# Patient Record
Sex: Female | Born: 1950 | Race: White | Hispanic: No | Marital: Married | Smoking: Never smoker
Health system: Southern US, Community
[De-identification: ages and names within clinical notes are randomized; demographics above are authoritative.]

## PROBLEM LIST (undated history)

## (undated) DIAGNOSIS — E785 Hyperlipidemia, unspecified: Secondary | ICD-10-CM

## (undated) DIAGNOSIS — L570 Actinic keratosis: Secondary | ICD-10-CM

## (undated) DIAGNOSIS — E039 Hypothyroidism, unspecified: Secondary | ICD-10-CM

## (undated) HISTORY — PX: CARPAL TUNNEL RELEASE: SHX101

## (undated) HISTORY — DX: Actinic keratosis: L57.0

---

## 2015-08-02 ENCOUNTER — Ambulatory Visit: Payer: BLUE CROSS/BLUE SHIELD

## 2015-08-02 ENCOUNTER — Ambulatory Visit
Admission: EM | Admit: 2015-08-02 | Discharge: 2015-08-02 | Disposition: A | Discharge status: Home / Self Care | Payer: BLUE CROSS/BLUE SHIELD | Attending: Internal Medicine | Admitting: Internal Medicine

## 2015-08-02 DIAGNOSIS — W1809XA Striking against other object with subsequent fall, initial encounter: Secondary | ICD-10-CM | POA: Diagnosis not present

## 2015-08-02 DIAGNOSIS — S0990XA Unspecified injury of head, initial encounter: Secondary | ICD-10-CM | POA: Diagnosis not present

## 2015-08-02 HISTORY — DX: Hypothyroidism, unspecified: E03.9

## 2015-08-02 HISTORY — DX: Hyperlipidemia, unspecified: E78.5

## 2015-08-02 NOTE — Discharge Instructions (Signed)
Call PCP  Office on Tuesday for 1 week follow up exam Report to [facility] or ER with any change in condition as noted below or as discussed  Concussion A concussion, or closed-head injury, is a brain injury caused by a direct blow to the head or by a quick and sudden movement (jolt) of the head or neck. Concussions are usually not life-threatening. Even so, the effects of a concussion can be serious. If you have had a concussion before, you are more likely to experience concussion-like symptoms after a direct blow to the head.  CAUSES  Direct blow to the head, such as from running into another player during a soccer game, being hit in a fight, or hitting your head on a hard surface.  A jolt of the head or neck that causes the brain to move back and forth inside the skull, such as in a car crash. SIGNS AND SYMPTOMS The signs of a concussion can be hard to notice. Early on, they may be missed by you, family members, and health care providers. You may look fine but act or feel differently. Symptoms are usually temporary, but they may last for days, weeks, or even longer. Some symptoms may appear right away while others may not show up for hours or days. Every head injury is different. Symptoms include:  Mild to moderate headaches that will not go away.  A feeling of pressure inside your head.  Having more trouble than usual:  Learning or remembering things you have heard.  Answering questions.  Paying attention or concentrating.  Organizing daily tasks.  Making decisions and solving problems.  Slowness in thinking, acting or reacting, speaking, or reading.  Getting lost or being easily confused.  Feeling tired all the time or lacking energy (fatigued).  Feeling drowsy.  Sleep disturbances.  Sleeping more than usual.  Sleeping less than usual.  Trouble falling asleep.  Trouble sleeping (insomnia).  Loss of balance or feeling lightheaded or dizzy.  Nausea or  vomiting.  Numbness or tingling.  Increased sensitivity to:  Sounds.  Lights.  Distractions.  Vision problems or eyes that tire easily.  Diminished sense of taste or smell.  Ringing in the ears.  Mood changes such as feeling sad or anxious.  Becoming easily irritated or angry for little or no reason.  Lack of motivation.  Seeing or hearing things other people do not see or hear (hallucinations). DIAGNOSIS Your health care provider can usually diagnose a concussion based on a description of your injury and symptoms. He or she will ask whether you passed out (lost consciousness) and whether you are having trouble remembering events that happened right before and during your injury. Your evaluation might include:  A brain scan to look for signs of injury to the brain. Even if the test shows no injury, you may still have a concussion.  Blood tests to be sure other problems are not present. TREATMENT  Concussions are usually treated in an emergency department, in urgent care, or at a clinic. You may need to stay in the hospital overnight for further treatment.  Tell your health care provider if you are taking any medicines, including prescription medicines, over-the-counter medicines, and natural remedies. Some medicines, such as blood thinners (anticoagulants) and aspirin, may increase the chance of complications. Also tell your health care provider whether you have had alcohol or are taking illegal drugs. This information may affect treatment.  Your health care provider will send you home with important instructions to follow.  How fast you will recover from a concussion depends on many factors. These factors include how severe your concussion is, what part of your brain was injured, your age, and how healthy you were before the concussion.  Most people with mild injuries recover fully. Recovery can take time. In general, recovery is slower in older persons. Also, persons who  have had a concussion in the past or have other medical problems may find that it takes longer to recover from their current injury. HOME CARE INSTRUCTIONS General Instructions  Carefully follow the directions your health care provider gave you.  Only take over-the-counter or prescription medicines for pain, discomfort, or fever as directed by your health care provider.  Take only those medicines that your health care provider has approved.  Do not drink alcohol until your health care provider says you are well enough to do so. Alcohol and certain other drugs may slow your recovery and can put you at risk of further injury.  If it is harder than usual to remember things, write them down.  If you are easily distracted, try to do one thing at a time. For example, do not try to watch TV while fixing dinner.  Talk with family members or close friends when making important decisions.  Keep all follow-up appointments. Repeated evaluation of your symptoms is recommended for your recovery.  Watch your symptoms and tell others to do the same. Complications sometimes occur after a concussion. Older adults with a brain injury may have a higher risk of serious complications, such as a blood clot on the brain.  Tell your teachers, school nurse, school counselor, coach, athletic trainer, or work Freight forwarder about your injury, symptoms, and restrictions. Tell them about what you can or cannot do. They should watch for:  Increased problems with attention or concentration.  Increased difficulty remembering or learning new information.  Increased time needed to complete tasks or assignments.  Increased irritability or decreased ability to cope with stress.  Increased symptoms.  Rest. Rest helps the brain to heal. Make sure you:  Get plenty of sleep at night. Avoid staying up late at night.  Keep the same bedtime hours on weekends and weekdays.  Rest during the day. Take daytime naps or rest breaks  when you feel tired.  Limit activities that require a lot of thought or concentration. These include:  Doing homework or job-related work.  Watching TV.  Working on the computer.  Avoid any situation where there is potential for another head injury (football, hockey, soccer, basketball, martial arts, downhill snow sports and horseback riding). Your condition will get worse every time you experience a concussion. You should avoid these activities until you are evaluated by the appropriate follow-up health care providers. Returning To Your Regular Activities You will need to return to your normal activities slowly, not all at once. You must give your body and brain enough time for recovery.  Do not return to sports or other athletic activities until your health care provider tells you it is safe to do so.  Ask your health care provider when you can drive, ride a bicycle, or operate heavy machinery. Your ability to react may be slower after a brain injury. Never do these activities if you are dizzy.  Ask your health care provider about when you can return to work or school. Preventing Another Concussion It is very important to avoid another brain injury, especially before you have recovered. In rare cases, another injury can lead to permanent  brain damage, brain swelling, or death. The risk of this is greatest during the first 7-10 days after a head injury. Avoid injuries by:  Wearing a seat belt when riding in a car.  Drinking alcohol only in moderation.  Wearing a helmet when biking, skiing, skateboarding, skating, or doing similar activities.  Avoiding activities that could lead to a second concussion, such as contact or recreational sports, until your health care provider says it is okay.  Taking safety measures in your home.  Remove clutter and tripping hazards from floors and stairways.  Use grab bars in bathrooms and handrails by stairs.  Place non-slip mats on floors and in  bathtubs.  Improve lighting in dim areas. SEEK MEDICAL CARE IF:  You have increased problems paying attention or concentrating.  You have increased difficulty remembering or learning new information.  You need more time to complete tasks or assignments than before.  You have increased irritability or decreased ability to cope with stress.  You have more symptoms than before. Seek medical care if you have any of the following symptoms for more than 2 weeks after your injury:  Lasting (chronic) headaches.  Dizziness or balance problems.  Nausea.  Vision problems.  Increased sensitivity to noise or light.  Depression or mood swings.  Anxiety or irritability.  Memory problems.  Difficulty concentrating or paying attention.  Sleep problems.  Feeling tired all the time. SEEK IMMEDIATE MEDICAL CARE IF:  You have severe or worsening headaches. These may be a sign of a blood clot in the brain.  You have weakness (even if only in one hand, leg, or part of the face).  You have numbness.  You have decreased coordination.  You vomit repeatedly.  You have increased sleepiness.  One pupil is larger than the other.  You have convulsions.  You have slurred speech.  You have increased confusion. This may be a sign of a blood clot in the brain.  You have increased restlessness, agitation, or irritability.  You are unable to recognize people or places.  You have neck pain.  It is difficult to wake you up.  You have unusual behavior changes.  You lose consciousness. MAKE SURE YOU:  Understand these instructions.  Will watch your condition.  Will get help right away if you are not doing well or get worse. Document Released: 02/04/2004 Document Revised: 11/19/2013 Document Reviewed: 06/06/2013 [facility] Patient Information 2015 [place], [place]. This information is not intended to replace advice given to you by your health care provider. Make sure you discuss any  questions you have with your health care provider.

## 2015-08-02 NOTE — ED Provider Notes (Signed)
CSN: [id number]     Arrival date & time 08/02/15  0807 History   First MD Initiated Contact with Patient 08/02/15 [phone]     Chief Complaint  Patient presents with  . Fall  . Head Injury   (Consider location/radiation/quality/duration/timing/severity/associated sxs/prior Treatment) HPI  64 yo F up at 4 am to close window -cat disturbance- back to bed and asleep. Arose approx 5:30 to open window- Unsure of mechanism but tripped and fell hitting forehead directly on wooden windowsill, striking just above eyebrows midline.  Unsure whether she lost consciousness. Husband states she called for him at 5:40 a- She was fearful and "couldn't make right arm work". He states as he reached for her she had equal arm strength and grip strength holding his hands and they pulled her up together.  Immediate reaction was to go make coffee , which she did. Then prepared icepak and went to [employer]. Large raised bruise forehead noted. Frontal headache resolved while resting but h/a developed back of head. This has also resolved now. Has had transient waves of nausea but no vomiting. 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 No episodes recently. Has had NO dizziness with accident to this point. Was prepared to dress and go to church but husband insisted on medical evaluation. She dressed herself and put a pot roast in the oven before presenting. He drove them here. Her concern now is that her head feels "full of pressure". Denies any other body discomforts except carpet burn right knee. Arm weakness totally resolved,No neck pain. No balance issues. Past Medical History  Diagnosis Date  . Hypothyroidism   . Hyperlipidemia    Past Surgical History  Procedure Laterality Date  . Carpal tunnel release     Family History  Problem Relation Age of Onset  . COPD Mother   . Lung cancer Mother    Social History  Substance Use Topics  . Smoking status: Never Smoker   . Smokeless tobacco: None  . Alcohol  Use: No   OB History    No data available     Review of Systems  Constitutional -afebrile " Head feels pressure/full" Eyes-denies visual changes ( wears glasses-reports bad vision) ENT- normal voice,denies sore throat CV-denies chest pain Resp-denies SOB GI- positive for brief nausea, no vomiting, no diarrhea GU- negative for dysuria MSK- negative for neck or back pain, ambulatory Skin- abrasion right knee Neuro- negative headache on arrival ,focal weakness or numbness    Allergies  Codeine  Home Medications   Prior to Admission medications   Medication Sig Start Date End Date Taking? Authorizing Provider  atorvastatin (LIPITOR) 10 MG tablet Take 10 mg by mouth daily.   Yes Historical Provider, MD  levothyroxine (SYNTHROID, LEVOTHROID) 112 MCG tablet Take 112 mcg by mouth daily before breakfast.   Yes Historical Provider, MD  Multiple Vitamins-Minerals (WOMENS MULTI) CAPS Take by mouth.   Yes Historical Provider, MD  naproxen sodium (ANAPROX) 220 MG tablet Take 220 mg by mouth 2 (two) times daily with a meal.   Yes Historical Provider, MD   Meds Ordered and Administered this Visit  Medications - No data to display  BP 105/56 mmHg  Pulse 75  Temp(Src) 98.7 F (37.1 C) (Oral)  Resp 17  Ht 5\' 4"  (1.626 m)  Wt 148 lb (67.132 kg)  BMI 25.39 kg/m2  SpO2 98% No data found.   Physical Exam   Constitutional -alert and oriented,conversive,reports history clearly,including unsure if any LOC Head- normocephalic, approx 6 x  3 cm  Raised tender bruise midline at eyebrow level Eyes- conjunctiva normal, EOMI ,conjugate gaze, denies visual changes; wears glasses-not at present Ears- negative canals, neg hemotympanum Nose- no congestion or rhinorrhea, no blood in nostrils Mouth/throat- mucous membranes moist ,no dental injury, missing right lower dental appliance removed last week electively Neck- supple without glandular enlargement, FROM wo pain, Nexus criteria negative CV-  regular rate, grossly normal heart sounds,  Resp-no distress, normal respiratory effort,clear to auscultation bilaterally GI- soft,non-tender,no distention GU- not examined MSK- non tender, normal ROM, neurosensory deficit negative. Right arm FROM , strength c/w left arm and WNL. Good grip bilaterally, good cap fill. Shoulder motion bilaterally FROM. Denies neurosensory changes. Right knee with mild superficial abrasion "carpet burn" all extremities grossly WNL, ambulatory, self-care Neuro- normal speech and language, no gross focal neurological deficit appreciated, no gait instability,CNs ll-Xll grossly WNL; ambulatory without assistance Skin-warm,dry ,intact; no rash noted Psych-mood and affect grossly normal; speech and behavior grossly normal  ED Course  Procedures (including critical care time)  Labs Review Labs Reviewed - No data to display  Imaging Review CT results failed to drop- CT of head, neck and maxillofacial all reported as WNL w/o evidence fracture or dislocation; Forehead hematoma identified.  Ice pack to forehead while in [facility] Offered pain Rx but she repeatedly deferred reporting there was no significant pain, forehead just tender. Right knee wound cleansed and dressed with triple antibiotic and band-aid dressing  Visual Acutiy  Right 20/40     Left 20/30    Both 20/20  MDM   1. Head trauma, initial encounter   2. Fall against object, initial encounter    Unknown whether LOC was experienced, but that question and  given transient change in perception of right arm function Have discussed concussion diagnosis with couple and parameters to respect. Avoid screens TV, phone , computer for 24 hours- Recommend decreased lighting and eye/brain rest. If no headache or visual issues may return for short periods but cease with onset headache and step back in acitvity.  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 See activity  prior to reporting for evaluation/ Informational handout given. See PCP Dr Ronnald Collum  in one week or more quickly if concerns. [facility] [place] as needed with concerns or excerbation.        Jan Fireman, PA-C 08/05/15 [phone]

## 2015-08-02 NOTE — ED Notes (Addendum)
Patient states that she fell out of her bed this morning around 530am. She states that she is not sure if foot got caught in the sheets or she just tripped. She states that she banged forehead against window seal. She has noticeable swelling and bruising in between eyes and a knot. Her husband reports that she did not call out for him until 540am, so she is not sure if she passed out or not. She states that she also has some carpet burn on her knee. She states that she started getting nausea about 15 mins ago and has been having a lot of belching. Patient states that she is having a lot of head pressure.

## 2015-08-05 ENCOUNTER — Encounter: Payer: Self-pay | Admitting: Physician Assistant

## 2017-10-26 ENCOUNTER — Ambulatory Visit: Payer: Medicare Other

## 2017-10-26 ENCOUNTER — Ambulatory Visit
Admission: EM | Admit: 2017-10-26 | Discharge: 2017-10-26 | Disposition: A | Discharge status: Home / Self Care | Payer: Medicare Other | Attending: Family Medicine | Admitting: Family Medicine

## 2017-10-26 ENCOUNTER — Other Ambulatory Visit: Payer: Self-pay

## 2017-10-26 DIAGNOSIS — E785 Hyperlipidemia, unspecified: Secondary | ICD-10-CM | POA: Diagnosis not present

## 2017-10-26 DIAGNOSIS — E039 Hypothyroidism, unspecified: Secondary | ICD-10-CM | POA: Diagnosis not present

## 2017-10-26 DIAGNOSIS — R42 Dizziness and giddiness: Secondary | ICD-10-CM | POA: Insufficient documentation

## 2017-10-26 DIAGNOSIS — R112 Nausea with vomiting, unspecified: Secondary | ICD-10-CM | POA: Insufficient documentation

## 2017-10-26 DIAGNOSIS — R51 Headache: Secondary | ICD-10-CM | POA: Diagnosis present

## 2017-10-26 DIAGNOSIS — R519 Headache, unspecified: Secondary | ICD-10-CM

## 2017-10-26 DIAGNOSIS — Z79899 Other long term (current) drug therapy: Secondary | ICD-10-CM | POA: Diagnosis not present

## 2017-10-26 MED ORDER — KETOROLAC TROMETHAMINE 30 MG/ML IJ SOLN
30.0000 mg | Freq: Once | INTRAMUSCULAR | Status: AC
  Administered 2017-10-26: 30 mg via INTRAMUSCULAR

## 2017-10-26 MED ORDER — ONDANSETRON 8 MG PO TBDP
8.0000 mg | ORAL_TABLET | Freq: Once | ORAL | Status: AC
  Administered 2017-10-26: 8 mg via ORAL

## 2017-10-26 MED ORDER — ONDANSETRON 4 MG PO TBDP: 4.0000 mg | ORAL_TABLET | Freq: Three times a day (TID) | ORAL | 0 refills | Status: DC | PRN

## 2017-10-26 NOTE — ED Triage Notes (Signed)
4 day hx nausea and vomitting with severe headache and neck stiffness.   Hx vertigo for years.  Excedrin migraine has not helped.  Tried zofran with relief. Wonders if she has caffeine withdrawal

## 2017-10-26 NOTE — Discharge Instructions (Signed)
Zofran as needed.  Call your primary.  Take care  Dr. Lacinda Axon

## 2017-10-26 NOTE — ED Provider Notes (Signed)
[facility]    CSN: [id number] Arrival date & time: 10/26/17  1927  History   Chief Complaint Chief Complaint  Patient presents with  . Nausea  . Vomiting  . Headache   HPI  66 year old female presents with complaints of headache.  Patient states that on Monday she developed severe vertigo.  She states that she has had this in the past.  She called her audiologist and was seen.  She states that Epley maneuvers were performed and she had a resolution of her vertigo.  However, the accompanying nausea and vomiting has continued to persist.  She is also had "extreme headache".  She states that she has been able to keep very little down.  She has vomited several times a day.  Today she is only vomited once.  She reports associated phonophobia.  No photophobia.  She states that she has had headaches in the past but has been many years ago when she was perimenopausal/menopausal.  Additionally, patient reports associated stiff neck.  No fevers or chills.  She states that her vertigo was brought out by doing activities while singing in church.  She states that she has taken some medication without significant improvement in her symptoms.  She is concerned about her symptoms as headache and persistent nausea and vomiting are not typical symptoms for her vertigo.  No other associated symptoms.  No other complaints at this time.  Past Medical History:  Diagnosis Date  . Hyperlipidemia   . Hypothyroidism     Past Surgical History:  Procedure Laterality Date  . CARPAL TUNNEL RELEASE      OB History    No data available      Home Medications    Prior to Admission medications   Medication Sig Start Date End Date Taking? Authorizing Provider  atorvastatin (LIPITOR) 10 MG tablet Take 10 mg by mouth daily.   Yes [provider]  levothyroxine (SYNTHROID, LEVOTHROID) 112 MCG tablet Take 112 mcg by mouth daily before breakfast.   Yes [provider]  Multiple  Vitamins-Minerals (WOMENS MULTI) CAPS Take by mouth.   Yes [provider]  ondansetron (ZOFRAN-ODT) 4 MG disintegrating tablet Take 1 tablet (4 mg total) by mouth every 8 (eight) hours as needed for nausea or vomiting. 10/26/17   Coral Spikes, DO    Family History Family History  Problem Relation Age of Onset  . COPD Mother   . Lung cancer Mother     Social History Social History   Tobacco Use  . Smoking status: Never Smoker  Substance Use Topics  . Alcohol use: No    Alcohol/week: 0.0 oz  . Drug use: Not on file     Allergies   Codeine   Review of Systems Review of Systems  Constitutional: Negative for fever.  Gastrointestinal: Positive for nausea and vomiting.  Musculoskeletal: Positive for neck stiffness.  Neurological: Positive for dizziness and headaches.  All other systems reviewed and are negative.  Physical Exam Triage Vital Signs ED Triage Vitals  Enc Vitals Group     BP 10/26/17 2002 128/68     Pulse Rate 10/26/17 2002 74     Resp --      Temp 10/26/17 2002 98.7 F (37.1 C)     Temp Source 10/26/17 2002 Oral     SpO2 10/26/17 2002 96 %     Weight 10/26/17 1957 150 lb (68 kg)     Height 10/26/17 1957 5\' 3"  (1.6 m)  Head Circumference --      Peak Flow --      Pain Score 10/26/17 1957 6     Pain Loc --      Pain Edu? --      Excl. in [place]? --    Updated Vital Signs BP 128/68 (BP Location: Left Arm)   Pulse 74   Temp 98.7 F (37.1 C) (Oral)   Ht 5\' 3"  (1.6 m)   Wt 150 lb (68 kg)   SpO2 96%   BMI 26.57 kg/m   Physical Exam  Constitutional: She is oriented to person, place, and time. She appears well-developed and well-nourished. She does not appear ill.  HENT:  Head: Normocephalic and atraumatic.  Mouth/Throat: Oropharynx is clear and moist.  Eyes: EOM are normal. Pupils are equal, round, and reactive to light.  Neck:  Decreased ROM in flexion.  Negative Kernig's sign.  Cardiovascular: Normal rate and regular rhythm.  No  murmur heard. Pulmonary/Chest: Effort normal and breath sounds normal. She has no wheezes. She has no rales.  Neurological: She is alert and oriented to person, place, and time. She has normal strength. No cranial nerve deficit or sensory deficit. She displays a negative Romberg sign. Coordination abnormal.  Finger to nose test - patient could not take finger to the tip of my finger.  Skin: Skin is warm. No rash noted.  Psychiatric: She has a normal mood and affect. Her behavior is normal.  Vitals reviewed.  UC Treatments / Results  Labs (all labs ordered are listed, but only abnormal results are displayed) Labs Reviewed - No data to display  EKG  EKG Interpretation None       Radiology Ct Head Wo Contrast  Result Date: 10/26/2017 CLINICAL DATA:  Headache for 1 week. EXAM: CT HEAD WITHOUT CONTRAST TECHNIQUE: Contiguous axial images were obtained from the base of the skull through the vertex without intravenous contrast. COMPARISON:  08/02/2015 FINDINGS: Brain: No mass lesion, hemorrhage, hydrocephalus, acute infarct, intra-axial, or extra-axial fluid collection. Vascular: Intracranial atherosclerosis. Skull: Normal Sinuses/Orbits: Normal imaged portions of the orbits and globes. Hypoplastic frontal sinuses. Other paranasal sinuses and mastoid air cells are clear. Other: None. IMPRESSION: No acute intracranial abnormality. Electronically Signed   By: Abigail Miyamoto M.D.   On: 10/26/2017 21:07    Procedures Procedures (including critical care time)  Medications Ordered in UC Medications  ondansetron (ZOFRAN-ODT) disintegrating tablet 8 mg (8 mg Oral Given 10/26/17 2030)  ketorolac (TORADOL) 30 MG/ML injection 30 mg (30 mg Intramuscular Given 10/26/17 2102)     Initial Impression / Assessment and Plan / UC Course  I have reviewed the triage vital signs and the nursing notes.  Pertinent labs & imaging results that were available during my care of the patient were reviewed by me and  considered in my medical decision making (see chart for details).     66 year old female presents with new onset headache, persistent nausea and vomiting, and stiff neck.  No signs of meningitis.  Exam is only remarkable for impaired finger-nose test.  Given her age and symptomatology, CT was obtained and was negative.  Patient was given Zofran and Toradol.  Advised to follow-up with primary.  Final Clinical Impressions(s) / UC Diagnoses   Final diagnoses:  Acute intractable headache, unspecified headache type    ED Discharge Orders        Ordered    ondansetron (ZOFRAN-ODT) 4 MG disintegrating tablet  Every 8 hours PRN     10/26/17 2111  Controlled Substance Prescriptions [place] Controlled Substance Registry consulted? Not Applicable   Coral Spikes, DO 10/26/17 2116

## 2018-09-03 ENCOUNTER — Other Ambulatory Visit: Payer: Self-pay | Admitting: Otolaryngology

## 2018-09-03 DIAGNOSIS — H903 Sensorineural hearing loss, bilateral: Secondary | ICD-10-CM

## 2018-09-03 DIAGNOSIS — R439 Unspecified disturbances of smell and taste: Secondary | ICD-10-CM

## 2018-09-03 DIAGNOSIS — R42 Dizziness and giddiness: Secondary | ICD-10-CM

## 2018-09-21 ENCOUNTER — Ambulatory Visit
Admission: RE | Admit: 2018-09-21 | Discharge: 2018-09-21 | Disposition: A | Discharge status: Home / Self Care | Payer: Medicare Other | Source: Ambulatory Visit | Attending: Otolaryngology | Admitting: Otolaryngology

## 2018-09-21 DIAGNOSIS — M21371 Foot drop, right foot: Secondary | ICD-10-CM | POA: Insufficient documentation

## 2018-09-21 DIAGNOSIS — H903 Sensorineural hearing loss, bilateral: Secondary | ICD-10-CM | POA: Insufficient documentation

## 2018-09-21 DIAGNOSIS — R42 Dizziness and giddiness: Secondary | ICD-10-CM | POA: Diagnosis not present

## 2018-09-21 DIAGNOSIS — R439 Unspecified disturbances of smell and taste: Secondary | ICD-10-CM | POA: Diagnosis not present

## 2018-09-21 MED ORDER — GADOBUTROL 1 MMOL/ML IV SOLN
7.5000 mL | Freq: Once | INTRAVENOUS | Status: AC | PRN
  Administered 2018-09-21: 7 mL via INTRAVENOUS

## 2018-09-26 ENCOUNTER — Ambulatory Visit: Payer: BLUE CROSS/BLUE SHIELD

## 2018-12-29 ENCOUNTER — Ambulatory Visit (INDEPENDENT_AMBULATORY_CARE_PROVIDER_SITE_OTHER): Payer: Medicare Other

## 2018-12-29 ENCOUNTER — Ambulatory Visit
Admission: EM | Admit: 2018-12-29 | Discharge: 2018-12-29 | Disposition: A | Discharge status: Home / Self Care | Payer: Medicare Other | Attending: Emergency Medicine | Admitting: Emergency Medicine

## 2018-12-29 ENCOUNTER — Encounter: Payer: Self-pay | Admitting: Emergency Medicine

## 2018-12-29 ENCOUNTER — Other Ambulatory Visit: Payer: Self-pay

## 2018-12-29 DIAGNOSIS — J22 Unspecified acute lower respiratory infection: Secondary | ICD-10-CM

## 2018-12-29 LAB — RAPID INFLUENZA A&B ANTIGENS (ARMC ONLY)
INFLUENZA A (ARMC): NEGATIVE
INFLUENZA B (ARMC): NEGATIVE

## 2018-12-29 LAB — RAPID STREP SCREEN (MED CTR MEBANE ONLY): Streptococcus, Group A Screen (Direct): NEGATIVE

## 2018-12-29 MED ORDER — FLUTICASONE PROPIONATE 50 MCG/ACT NA SUSP: 2.0000 | Freq: Every day | NASAL | 0 refills | Status: DC

## 2018-12-29 MED ORDER — AEROCHAMBER PLUS MISC: 2 refills | Status: DC

## 2018-12-29 MED ORDER — ALBUTEROL SULFATE HFA 108 (90 BASE) MCG/ACT IN AERS: 2.0000 | INHALATION_SPRAY | RESPIRATORY_TRACT | 0 refills | Status: DC | PRN

## 2018-12-29 MED ORDER — DOXYCYCLINE HYCLATE 100 MG PO CAPS: 100.0000 mg | ORAL_CAPSULE | Freq: Two times a day (BID) | ORAL | 0 refills | Status: AC

## 2018-12-29 NOTE — ED Triage Notes (Signed)
Patient c/o sore throat, cough, nasal congestion, and bodyaches that started yesterday.

## 2018-12-29 NOTE — ED Provider Notes (Signed)
HPI  SUBJECTIVE:  Alyssa Webb is a 68 y.o. female who presents with the acute onset of body aches, headaches, nasal congestion, sore throat, nausea, cough productive of dark green sputum starting yesterday.  No fevers, rhinorrhea, postnasal drip, ear pain, vomiting, neck stiffness.  No wheezing, chest pain, shortness of breath, dyspnea on exertion.  No abdominal pain, diarrhea, rash.  She got a flu shot this year.  She has been visiting her husband in the hospital and just brought him home.  He is currently immunocompromised.  She has tried Coricidin.  She is able to sleep through the night with this.  Otherwise no aggravating or alleviating factors.  No antibiotics in the past month, antipyretic in the past 4 to 6 hours.  She has a past medical history of hypothyroidism, murmur.  No history of pulmonary disease, smoking, diabetes, hypertension.  [id number], Lourdes Sledge, MD    Past Medical History:  Diagnosis Date  . Hyperlipidemia   . Hypothyroidism     Past Surgical History:  Procedure Laterality Date  . CARPAL TUNNEL RELEASE      Family History  Problem Relation Age of Onset  . COPD Mother   . Lung cancer Mother     Social History   Tobacco Use  . Smoking status: Never Smoker  . Smokeless tobacco: Never Used  Substance Use Topics  . Alcohol use: No    Alcohol/week: 0.0 standard drinks  . Drug use: Not on file    No current facility-administered medications for this encounter.   Current Outpatient Medications:  .  atorvastatin (LIPITOR) 10 MG tablet, Take 10 mg by mouth daily., Disp: , Rfl:  .  levothyroxine (SYNTHROID, LEVOTHROID) 112 MCG tablet, Take 112 mcg by mouth daily before breakfast., Disp: , Rfl:  .  Multiple Vitamins-Minerals (WOMENS MULTI) CAPS, Take by mouth., Disp: , Rfl:  .  albuterol (PROVENTIL HFA;VENTOLIN HFA) 108 (90 Base) MCG/ACT inhaler, Inhale 2 puffs into the lungs every 4 (four) hours as needed for wheezing or shortness of breath. Dispense with  aerochamber, Disp: 1 Inhaler, Rfl: 0 .  doxycycline (VIBRAMYCIN) 100 MG capsule, Take 1 capsule (100 mg total) by mouth 2 (two) times daily for 7 days., Disp: 14 capsule, Rfl: 0 .  fluticasone (FLONASE) 50 MCG/ACT nasal spray, Place 2 sprays into both nostrils daily., Disp: 16 g, Rfl: 0 .  ondansetron (ZOFRAN-ODT) 4 MG disintegrating tablet, Take 1 tablet (4 mg total) by mouth every 8 (eight) hours as needed for nausea or vomiting., Disp: 20 tablet, Rfl: 0 .  Spacer/Aero-Holding Chambers (AEROCHAMBER PLUS) inhaler, Use as instructed, Disp: 1 each, Rfl: 2  Allergies  Allergen Reactions  . Codeine      ROS  As noted in HPI.   Physical Exam  BP 117/65 (BP Location: Left Arm)   Pulse 83   Temp 98.3 F (36.8 C) (Oral)   Resp 14   Ht 5\' 3"  (1.6 m)   Wt 58.1 kg   SpO2 100%   BMI 22.67 kg/m   Constitutional: Well developed, well nourished, no acute distress Eyes:  EOMI, conjunctiva normal bilaterally HENT: Normocephalic, atraumatic,mucus membranes moist positive nasal congestion with erythematous, swollen turbinates.  No frontal sinus tenderness.  No maxillary sinus tenderness.  Normal tonsils, uvula midline.  No postnasal drip. Neck: Shotty cervical lymphadenopathy, no meningismus. Respiratory: Normal inspiratory effort no wheezing. rhonchi left side.  Fair air movement. Cardiovascular: Normal rate and rhythm, no murmurs, rubs, gallops GI: nondistended skin: No rash, skin intact  Musculoskeletal: no deformities Neurologic: Alert & oriented x 3, no focal neuro deficits Psychiatric: Speech and behavior appropriate   ED Course   Medications - No data to display  Orders Placed This Encounter  Procedures  . Rapid Strep Screen ([facility] ONLY)    Standing Status:   Standing    Number of Occurrences:   1  . Rapid Influenza A&B Antigens ([facility] only)    Standing Status:   Standing    Number of Occurrences:   1  . Culture, group A strep    Standing Status:   Standing     Number of Occurrences:   1  . DG Chest 2 View    Standing Status:   Standing    Number of Occurrences:   1    Order Specific Question:   Reason for Exam (SYMPTOM  OR DIAGNOSIS REQUIRED)    Answer:   r/o PNA  . Droplet precaution    Standing Status:   Standing    Number of Occurrences:   1    Results for orders placed or performed during the hospital encounter of 12/29/18 (from the past 24 hour(s))  Rapid Strep Screen ([facility] ONLY)     Status: None   Collection Time: 12/29/18  8:29 AM  Result Value Ref Range   Streptococcus, Group A Screen (Direct) NEGATIVE NEGATIVE  Rapid Influenza A&B Antigens ([facility] only)     Status: None   Collection Time: 12/29/18  8:29 AM  Result Value Ref Range   Influenza A ([facility]) NEGATIVE NEGATIVE   Influenza B ([facility]) NEGATIVE NEGATIVE   Dg Chest 2 View  Result Date: 12/29/2018 CLINICAL DATA:  Cough. EXAM: CHEST - 2 VIEW COMPARISON:  None. FINDINGS: The heart size and mediastinal contours are within normal limits. No pneumothorax or pleural effusion is noted. Atherosclerosis of thoracic aorta is noted. Calcified left hilar lymph nodes are noted consistent with prior granulomatous disease. Both lungs are clear. The visualized skeletal structures are unremarkable. IMPRESSION: No active cardiopulmonary disease. Aortic Atherosclerosis (ICD10-I70.0). Electronically Signed   By: Marijo Conception, M.D.   On: 12/29/2018 09:32    ED Clinical Impression  LRTI (lower respiratory tract infection)   ED Assessment/Plan  Patient has rhonchi and wheezing on the left side, will check a chest x-ray to rule out pneumonia.  Her strep and flu were negative.  Patient refused to DuoNeb here.    Presentation c/w a lower respiratory tract infection. Will treat with Flonase, 2 puffs from her albuterol inhaler with a spacer every 4 to 6 hours for the next several days, Mucinex D, saline nasal irrigation with a Milta Deiters med rinse and distilled water as often as she wants, patient  refused a prescription of prednisone.  Wait-and-see prescription of doxycycline.  Reviewed imaging independently.  No pneumonia.  See radiology report for full details.  Discussed labs, imaging, MDM, treatment plan, and plan for follow-up with patient.  patient agrees with plan.   Meds ordered this encounter  Medications  . doxycycline (VIBRAMYCIN) 100 MG capsule    Sig: Take 1 capsule (100 mg total) by mouth 2 (two) times daily for 7 days.    Dispense:  14 capsule    Refill:  0  . albuterol (PROVENTIL HFA;VENTOLIN HFA) 108 (90 Base) MCG/ACT inhaler    Sig: Inhale 2 puffs into the lungs every 4 (four) hours as needed for wheezing or shortness of breath. Dispense with aerochamber    Dispense:  1 Inhaler  Refill:  0  . Spacer/Aero-Holding Chambers (AEROCHAMBER PLUS) inhaler    Sig: Use as instructed    Dispense:  1 each    Refill:  2  . fluticasone (FLONASE) 50 MCG/ACT nasal spray    Sig: Place 2 sprays into both nostrils daily.    Dispense:  16 g    Refill:  0    *This clinic note was created using [employer]. Therefore, there may be occasional mistakes despite careful proofreading.   ?    Melynda Ripple, MD 12/29/18 1755

## 2018-12-29 NOTE — Discharge Instructions (Addendum)
Your x-ray was negative for a pneumonia.  Try Flonase, 2 puffs from your albuterol inhaler with a spacer every 4 to 6 hours for the next several days, then you can back off on it to as needed.  Also start Mucinex D, saline nasal irrigation with a Milta Deiters med rinse and distilled water as often as you want.  Try These medicines first for a day or 2, and if you are not getting better, then fill the doxycycline which is an antibiotic.  I suspect that this is viral.

## 2019-01-01 LAB — CULTURE, GROUP A STREP (THRC)

## 2019-01-06 ENCOUNTER — Ambulatory Visit
Admission: EM | Admit: 2019-01-06 | Discharge: 2019-01-06 | Disposition: A | Discharge status: Home / Self Care | Payer: Medicare Other | Attending: Family Medicine | Admitting: Family Medicine

## 2019-01-06 DIAGNOSIS — J40 Bronchitis, not specified as acute or chronic: Secondary | ICD-10-CM

## 2019-01-06 DIAGNOSIS — R05 Cough: Secondary | ICD-10-CM

## 2019-01-06 DIAGNOSIS — R062 Wheezing: Secondary | ICD-10-CM | POA: Diagnosis not present

## 2019-01-06 DIAGNOSIS — R059 Cough, unspecified: Secondary | ICD-10-CM

## 2019-01-06 MED ORDER — AMOXICILLIN-POT CLAVULANATE 875-125 MG PO TABS: 1.0000 | ORAL_TABLET | Freq: Two times a day (BID) | ORAL | 0 refills | Status: DC

## 2019-01-06 MED ORDER — PREDNISONE 20 MG PO TABS: ORAL_TABLET | ORAL | 0 refills | Status: DC

## 2019-01-06 NOTE — ED Triage Notes (Signed)
Pt here for productive cough for 1 week, headache and low grade fever and feels this illness is messing with her thyroid medication.

## 2019-01-06 NOTE — ED Provider Notes (Signed)
[facility]    CSN: [id number] Arrival date & time: 01/06/19  2122     History   Chief Complaint Chief Complaint  Patient presents with  . Cough    HPI Alyssa Webb is a 68 y.o. female.   The history is provided by the patient.  Cough  Associated symptoms: fever and wheezing   URI  Presenting symptoms: congestion, cough and fever   Severity:  Moderate Onset quality:  Sudden Duration:  2 weeks Timing:  Constant Progression:  Unchanged Chronicity:  New Relieved by:  Nothing Ineffective treatments:  Prescription medications and OTC medications (seen here last week and given doxycycline, however patient states she couldn't tolerate it (stomach upset)) Associated symptoms: wheezing   Risk factors: sick contacts     Past Medical History:  Diagnosis Date  . Hyperlipidemia   . Hypothyroidism     There are no active problems to display for this patient.   Past Surgical History:  Procedure Laterality Date  . CARPAL TUNNEL RELEASE      OB History   No obstetric history on file.      Home Medications    Prior to Admission medications   Medication Sig Start Date End Date Taking? Authorizing Provider  albuterol (PROVENTIL HFA;VENTOLIN HFA) 108 (90 Base) MCG/ACT inhaler Inhale 2 puffs into the lungs every 4 (four) hours as needed for wheezing or shortness of breath. Dispense with aerochamber 12/29/18  Yes Melynda Ripple, MD  atorvastatin (LIPITOR) 10 MG tablet Take 10 mg by mouth daily.   Yes [provider]  fluticasone (FLONASE) 50 MCG/ACT nasal spray Place 2 sprays into both nostrils daily. 12/29/18  Yes Melynda Ripple, MD  levothyroxine (SYNTHROID, LEVOTHROID) 112 MCG tablet Take 112 mcg by mouth daily before breakfast.   Yes [provider]  Multiple Vitamins-Minerals (WOMENS MULTI) CAPS Take by mouth.   Yes [provider]  ondansetron (ZOFRAN-ODT) 4 MG disintegrating tablet Take 1 tablet (4 mg total) by mouth every 8  (eight) hours as needed for nausea or vomiting. 10/26/17  Yes Cook, Barnie Del, DO  Spacer/Aero-Holding Chambers (AEROCHAMBER PLUS) inhaler Use as instructed 12/29/18  Yes Melynda Ripple, MD  amoxicillin-clavulanate (AUGMENTIN) 875-125 MG tablet Take 1 tablet by mouth 2 (two) times daily. 01/06/19   Norval Gable, MD  predniSONE (DELTASONE) 20 MG tablet 2 tabs po qd x 2 days, then 1 tab po qd x 2 days, then half a tab po qd x 2 days 01/06/19   Norval Gable, MD    Family History Family History  Problem Relation Age of Onset  . COPD Mother   . Lung cancer Mother     Social History Social History   Tobacco Use  . Smoking status: Never Smoker  . Smokeless tobacco: Never Used  Substance Use Topics  . Alcohol use: No    Alcohol/week: 0.0 standard drinks  . Drug use: Not on file     Allergies   Codeine   Review of Systems Review of Systems  Constitutional: Positive for fever.  HENT: Positive for congestion.   Respiratory: Positive for cough and wheezing.      Physical Exam Triage Vital Signs ED Triage Vitals  Enc Vitals Group     BP 01/06/19 0826 (!) 104/58     Pulse Rate 01/06/19 0826 85     Resp 01/06/19 0826 18     Temp 01/06/19 0826 100 F (37.8 C)     Temp Source 01/06/19 0826 Oral  SpO2 01/06/19 0826 97 %     Weight 01/06/19 0827 134 lb (60.8 kg)     Height 01/06/19 0827 5\' 3"  (1.6 m)     Head Circumference --      Peak Flow --      Pain Score 01/06/19 0827 4     Pain Loc --      Pain Edu? --      Excl. in [place]? --    No data found.  Updated Vital Signs BP (!) 104/58 (BP Location: Right Arm)   Pulse 85   Temp 100 F (37.8 C) (Oral)   Resp 18   Ht 5\' 3"  (1.6 m)   Wt 60.8 kg   SpO2 97%   BMI 23.74 kg/m   Visual Acuity Right Eye Distance:   Left Eye Distance:   Bilateral Distance:    Right Eye Near:   Left Eye Near:    Bilateral Near:     Physical Exam Constitutional:      General: She is not in acute distress.    Appearance: She is not  toxic-appearing or diaphoretic.  Cardiovascular:     Rate and Rhythm: Normal rate and regular rhythm.     Heart sounds: Normal heart sounds.  Pulmonary:     Effort: Pulmonary effort is normal. No respiratory distress.     Breath sounds: No stridor. Wheezing and rhonchi present. No rales.  Neurological:     Mental Status: She is alert.      UC Treatments / Results  Labs (all labs ordered are listed, but only abnormal results are displayed) Labs Reviewed - No data to display  EKG None  Radiology No results found.  Procedures Procedures (including critical care time)  Medications Ordered in UC Medications - No data to display  Initial Impression / Assessment and Plan / UC Course  I have reviewed the triage vital signs and the nursing notes.  Pertinent labs & imaging results that were available during my care of the patient were reviewed by me and considered in my medical decision making (see chart for details).      Final Clinical Impressions(s) / UC Diagnoses   Final diagnoses:  Cough  Bronchitis  Wheezing    ED Prescriptions    Medication Sig Dispense Auth. Provider   amoxicillin-clavulanate (AUGMENTIN) 875-125 MG tablet Take 1 tablet by mouth 2 (two) times daily. 20 tablet Norval Gable, MD   predniSONE (DELTASONE) 20 MG tablet 2 tabs po qd x 2 days, then 1 tab po qd x 2 days, then half a tab po qd x 2 days 7 tablet Shantrice Rodenberg, Linward Foster, MD     1. diagnosis reviewed with patient 2. rx as per orders above; reviewed possible side effects, interactions, risks and benefits  3. Recommend supportive treatment with increased fluids, otc meds prn; continue albuterol inhaler  4. Follow-up prn if symptoms worsen or don't improve  Controlled Substance Prescriptions [place] Controlled Substance Registry consulted? Not Applicable   Norval Gable, MD 01/06/19 [phone]

## 2020-04-15 ENCOUNTER — Other Ambulatory Visit: Payer: Self-pay

## 2020-04-15 ENCOUNTER — Ambulatory Visit: Payer: Medicare Other | Admitting: Dermatology

## 2020-04-15 DIAGNOSIS — Z872 Personal history of diseases of the skin and subcutaneous tissue: Secondary | ICD-10-CM

## 2020-04-15 DIAGNOSIS — L814 Other melanin hyperpigmentation: Secondary | ICD-10-CM

## 2020-04-15 DIAGNOSIS — I8393 Asymptomatic varicose veins of bilateral lower extremities: Secondary | ICD-10-CM

## 2020-04-15 DIAGNOSIS — D229 Melanocytic nevi, unspecified: Secondary | ICD-10-CM

## 2020-04-15 DIAGNOSIS — R634 Abnormal weight loss: Secondary | ICD-10-CM | POA: Diagnosis not present

## 2020-04-15 DIAGNOSIS — D1800 Hemangioma unspecified site: Secondary | ICD-10-CM

## 2020-04-15 DIAGNOSIS — L820 Inflamed seborrheic keratosis: Secondary | ICD-10-CM

## 2020-04-15 DIAGNOSIS — L988 Other specified disorders of the skin and subcutaneous tissue: Secondary | ICD-10-CM | POA: Diagnosis not present

## 2020-04-15 DIAGNOSIS — L821 Other seborrheic keratosis: Secondary | ICD-10-CM

## 2020-04-15 DIAGNOSIS — L578 Other skin changes due to chronic exposure to nonionizing radiation: Secondary | ICD-10-CM

## 2020-04-15 DIAGNOSIS — Z1283 Encounter for screening for malignant neoplasm of skin: Secondary | ICD-10-CM | POA: Diagnosis not present

## 2020-04-15 NOTE — Progress Notes (Signed)
Follow-Up Visit   Subjective  Alyssa Webb is a 69 y.o. female who presents for the following: Annual Exam (patient would like to discuss treatment options for facial elastosis). Patient presents for total body skin exam for skin cancer screening and mole check.  The following portions of the chart were reviewed this encounter and updated as appropriate:  Tobacco  Allergies  Meds  Problems  Med Hx  Surg Hx  Fam Hx     Review of Systems:  No other skin or systemic complaints except as noted in HPI or Assessment and Plan.  Objective  Well appearing patient in no apparent distress; mood and affect are within normal limits.  A full examination was performed including scalp, head, eyes, ears, nose, lips, neck, chest, axillae, abdomen, back, buttocks, bilateral upper extremities, bilateral lower extremities, hands, feet, fingers, toes, fingernails, and toenails. All findings within normal limits unless otherwise noted below.  Objective  Face: Rhytides and volume loss.   Objective  Nose: Clear  Objective  Right Lower Leg - Anterior: Erythematous keratotic or waxy stuck-on papule or plaque.    Assessment & Plan  Elastosis of skin Face  Due to age and recent weight loss - Discussed fillers to the tear troughs (Belotero), mid face and temple areas. Advised patient not covered by insurance and cost. We also discussed Botox to the frown complex (27.5 units), crow's feet (7.5 units each).  History of actinic keratosis Nose  Clear. Observe for recurrence. Call clinic for new or changing lesions.  Recommend regular skin exams, daily broad-spectrum spf 30+ sunscreen use, and photoprotection.     Inflamed seborrheic keratosis Right Lower Leg - Anterior  Destruction of lesion - Right Lower Leg - Anterior Complexity: simple   Destruction method: cryotherapy   Informed consent: discussed and consent obtained   Timeout:  patient name, date of birth, surgical site, and  procedure verified Lesion destroyed using liquid nitrogen: Yes   Region frozen until ice ball extended beyond lesion: Yes   Outcome: patient tolerated procedure well with no complications   Post-procedure details: wound care instructions given    Destruction of lesion - Right Lower Leg - Anterior Complexity: simple   Destruction method: cryotherapy   Informed consent: discussed and consent obtained   Timeout:  patient name, date of birth, surgical site, and procedure verified Lesion destroyed using liquid nitrogen: Yes   Region frozen until ice ball extended beyond lesion: Yes   Outcome: patient tolerated procedure well with no complications   Post-procedure details: wound care instructions given     Lentigines - Scattered tan macules - Discussed due to sun exposure - Benign, observe - Call for any changes  Seborrheic Keratoses - Stuck-on, waxy, tan-brown papules and plaques  - Discussed benign etiology and prognosis. - Observe - Call for any changes  Melanocytic Nevi - Tan-brown and/or pink-flesh-colored symmetric macules and papules - Benign appearing on exam today - Observation - Call clinic for new or changing moles - Recommend daily use of broad spectrum spf 30+ sunscreen to sun-exposed areas.   Hemangiomas - Red papules - Discussed benign nature - Observe - Call for any changes  Actinic Damage - diffuse scaly erythematous macules with underlying dyspigmentation - Recommend daily broad spectrum sunscreen SPF 30+ to sun-exposed areas, reapply every 2 hours as needed.  - Call for new or changing lesions.  Spider Veins - Dilated blue, purple or red veins at the lower extremities - Reassured - These can be treated by  sclerotherapy (a procedure to inject a medicine into the veins to make them disappear) if desired, but the treatment is not covered by insurance and is $350 per session  Skin cancer screening performed today.  Return in about 1 year (around  04/15/2021) for TBSE, cosmetic - fillers .  Luther Redo, CMA, am acting as scribe for Sarina Ser, MD .  Documentation: I have reviewed the above documentation for accuracy and completeness, and I agree with the above.  Sarina Ser, MD

## 2020-04-19 ENCOUNTER — Encounter: Payer: Self-pay | Admitting: Dermatology

## 2020-05-21 ENCOUNTER — Other Ambulatory Visit: Payer: Self-pay

## 2020-05-21 ENCOUNTER — Ambulatory Visit (INDEPENDENT_AMBULATORY_CARE_PROVIDER_SITE_OTHER): Payer: Self-pay | Admitting: Dermatology

## 2020-05-21 DIAGNOSIS — L988 Other specified disorders of the skin and subcutaneous tissue: Secondary | ICD-10-CM

## 2020-05-21 NOTE — Progress Notes (Signed)
   Follow-Up Visit   Subjective  Alyssa Webb is a 69 y.o. female who presents for the following: Facial Elastosis.  The following portions of the chart were reviewed this encounter and updated as appropriate:  Tobacco  Allergies  Meds  Problems  Med Hx  Surg Hx  Fam Hx      Review of Systems:  No other skin or systemic complaints except as noted in HPI or Assessment and Plan.  Objective  Well appearing patient in no apparent distress; mood and affect are within normal limits.  A focused examination was performed including face. Relevant physical exam findings are noted in the Assessment and Plan.  Objective  face: Rhytides and volume loss.   Images                                               Assessment & Plan    Elastosis of skin face  Discussed Voluma 1-2 syringes to mid face, and 1 syringe each temple for total 3-4 syringes.  Discussed Restylane Defyne to perioral area.  Discussed Belotero to tear trough areas after mid face, temple areas, and perioral area complete.  Today Voluma 1 syringe injected to: - Mid face   Lot [id number] exp 02/22/2021  Today Vollure XC 1 syringe injected to: - Mid face fine tuning - Bil temples Lot [id number] exp 03/30/2021 (no charge today because we only had 1 Voluma)  Filling material injection - face Prior to the procedure, the patient's past medical history, allergies and the rare but potential risks and complications were reviewed with the patient and a signed consent was obtained. Pre and post-treatment care was discussed and instructions provided.  Location: mid face, bil temples  Filler Type: Juvederm Voluma, Juvederm Vollure XC  Procedure: The area was prepped thoroughly with hibiclens The area was prepped thoroughly with Puracyn. After introducing the needle into the desired treatment area, the syringe plunger was drawn back to ensure there was no flash of blood prior  to injecting the filler in order to minimize risk of intravascular injection and vascular occlusion. After injection of the filler, the treated areas were cleansed and iced to reduce swelling. Post-treatment instructions were reviewed with the patient.       Patient tolerated the procedure well. The patient will call with any problems, questions or concerns prior to their next appointment.   Return for 2-8wks for filler.   I, Othelia Pulling, RMA, am acting as scribe for Sarina Ser, MD .  Documentation: I have reviewed the above documentation for accuracy and completeness, and I agree with the above.  Sarina Ser, MD

## 2020-05-26 ENCOUNTER — Encounter: Payer: Self-pay | Admitting: Dermatology

## 2020-06-18 ENCOUNTER — Other Ambulatory Visit: Payer: Self-pay

## 2020-06-18 ENCOUNTER — Ambulatory Visit (INDEPENDENT_AMBULATORY_CARE_PROVIDER_SITE_OTHER): Payer: Self-pay | Admitting: Dermatology

## 2020-06-18 DIAGNOSIS — L988 Other specified disorders of the skin and subcutaneous tissue: Secondary | ICD-10-CM

## 2020-06-18 NOTE — Progress Notes (Signed)
   Follow-Up Visit   Subjective  Alyssa Webb is a 69 y.o. female who presents for the following: Facial Elastosis (Fillers today).  The following portions of the chart were reviewed this encounter and updated as appropriate:  Tobacco  Allergies  Meds  Problems  Med Hx  Surg Hx  Fam Hx     Review of Systems:  No other skin or systemic complaints except as noted in HPI or Assessment and Plan.  Objective  Well appearing patient in no apparent distress; mood and affect are within normal limits.  A focused examination was performed including face. Relevant physical exam findings are noted in the Assessment and Plan.  Objective  Face: Rhytides and volume loss.   Images                           Assessment & Plan    Elastosis of skin Face  Voluma Lot #[id number] Exp 06/29/2021 - 1 syringe to bilateral cheeks/ mid face  Restylane Defyne Lot #[id number] Exp 07/28/2021 - 1 syringe injected to bilateral oral commissure, nasolabials; marionette lines  Discussed Belotero injections to bilateral tear troughs, and additional injections of Voluma to bilateral temple areas  Filling material injection - Face Prior to the procedure, the patient's past medical history, allergies and the rare but potential risks and complications were reviewed with the patient and a signed consent was obtained. Pre and post-treatment care was discussed and instructions provided.  Location: cheeks, oral commissures, chin  Filler Type: Restylane defyne and Juvederm Voluma  Procedure: The area was prepped thoroughly with Puracyn. After introducing the needle into the desired treatment area, the syringe plunger was drawn back to ensure there was no flash of blood prior to injecting the filler in order to minimize risk of intravascular injection and vascular occlusion. After injection of the filler, the treated areas were cleansed and iced to reduce swelling. Post-treatment instructions  were reviewed with the patient.       Patient tolerated the procedure well. The patient will call with any problems, questions or concerns prior to their next appointment.  Return for fillers as scheduled.  I, Ashok Cordia, CMA, am acting as scribe for Sarina Ser, MD .  Documentation: I have reviewed the above documentation for accuracy and completeness, and I agree with the above.  Sarina Ser, MD

## 2020-06-21 ENCOUNTER — Encounter: Payer: Self-pay | Admitting: Dermatology

## 2020-07-23 ENCOUNTER — Ambulatory Visit (INDEPENDENT_AMBULATORY_CARE_PROVIDER_SITE_OTHER): Payer: Medicare Other | Admitting: Dermatology

## 2020-07-23 ENCOUNTER — Other Ambulatory Visit: Payer: Self-pay

## 2020-07-23 DIAGNOSIS — L988 Other specified disorders of the skin and subcutaneous tissue: Secondary | ICD-10-CM

## 2020-07-23 NOTE — Progress Notes (Signed)
   Follow-Up Visit   Subjective  Alyssa Webb is a 69 y.o. female who presents for the following: Facial Elastosis (patient is here today to discuss treatment options).  The following portions of the chart were reviewed this encounter and updated as appropriate:  Tobacco  Allergies  Meds  Problems  Med Hx  Surg Hx  Fam Hx     Review of Systems:  No other skin or systemic complaints except as noted in HPI or Assessment and Plan.  Objective  Well appearing patient in no apparent distress; mood and affect are within normal limits.  A focused examination was performed including the face . Relevant physical exam findings are noted in the Assessment and Plan.  Objective  Face: Rhytides and volume loss.   Images                                            Assessment & Plan  Elastosis of skin Face  Botox 32.5 units injected as marked into: - Frown complex 27.5 units - Chin 5.0 units  Restylane Defyne injection into: - B/L oral commisure  - B/L nasolabial creases - Anterior chin  Lot number: [id number] Expiration date: 08/27/2021  Filling material injection - Face Prior to the procedure, the patient's past medical history, allergies and the rare but potential risks and complications were reviewed with the patient and a signed consent was obtained. Pre and post-treatment care was discussed and instructions provided.  Location: perioral, nasolabial folds, and chin  Filler Type: Restylane Defyne 1 syringe   Procedure: The area was prepped thoroughly with hibiclens The area was prepped thoroughly with Puracyn. After introducing the needle into the desired treatment area, the syringe plunger was drawn back to ensure there was no flash of blood prior to injecting the filler in order to minimize risk of intravascular injection and vascular occlusion. After injection of the filler, the treated areas were cleansed and iced to reduce swelling.  Post-treatment instructions were reviewed with the patient.       Patient tolerated the procedure well. The patient will call with any problems, questions or concerns prior to their next appointment.   Botox Injection - Face Location: See attached image  Informed consent: Discussed risks (infection, pain, bleeding, bruising, swelling, allergic reaction, paralysis of nearby muscles, eyelid droop, double vision, neck weakness, difficulty breathing, headache, undesirable cosmetic result, and need for additional treatment) and benefits of the procedure, as well as the alternatives.  Informed consent was obtained.  Preparation: The area was cleansed with alcohol.  Procedure Details:  Botox was injected into the dermis with a 30-gauge needle. Pressure applied to any bleeding. Ice packs offered for swelling.  Lot Number:  [id number] Expiration:  09/2022  Total Units Injected:  32.5  Plan: Patient was instructed to remain upright for 4 hours. Patient was instructed to avoid massaging the face and avoid vigorous exercise for the rest of the day. Tylenol may be used for headache.  Allow 2 weeks before returning to clinic for additional dosing as needed. Patient will call for any problems.   Return for appointment as scheduled - fillers and Botox recheck .  Luther Redo, CMA, am acting as scribe for Sarina Ser, MD .  Documentation: I have reviewed the above documentation for accuracy and completeness, and I agree with the above.  Sarina Ser, MD

## 2020-07-31 ENCOUNTER — Encounter: Payer: Self-pay | Admitting: Dermatology

## 2020-08-19 ENCOUNTER — Other Ambulatory Visit: Payer: Self-pay

## 2020-08-19 ENCOUNTER — Ambulatory Visit (INDEPENDENT_AMBULATORY_CARE_PROVIDER_SITE_OTHER): Payer: Self-pay | Admitting: Dermatology

## 2020-08-19 DIAGNOSIS — L988 Other specified disorders of the skin and subcutaneous tissue: Secondary | ICD-10-CM

## 2020-08-19 NOTE — Progress Notes (Signed)
   Follow-Up Visit   Subjective  Alyssa Webb is a 69 y.o. female who presents for the following: Facial Elastosis. Patient is here today to follow up on Botox injections and to inject more fillers.   The following portions of the chart were reviewed this encounter and updated as appropriate:  Tobacco  Allergies  Meds  Problems  Med Hx  Surg Hx  Fam Hx     Review of Systems:  No other skin or systemic complaints except as noted in HPI or Assessment and Plan.  Objective  Well appearing patient in no apparent distress; mood and affect are within normal limits.  A focused examination was performed including the face. Relevant physical exam findings are noted in the Assessment and Plan.   Assessment & Plan  Elastosis of skin Head - Anterior (Face)  Consider increasing procerus Botox injection to 10 units instead of 7.5 units at follow up appointment.  Restylane Defyne 1cc injected into: - B/L jaw line - Anterior chin - B/L nasolabial   Lot number: [id number] Expiration date: 01/25/2022  Filling material injection - Head - Anterior (Face) Prior to the procedure, the patient's past medical history, allergies and the rare but potential risks and complications were reviewed with the patient and a signed consent was obtained. Pre and post-treatment care was discussed and instructions provided.  Location: marionette lines, chin, and jaws  Filler Type: Restylane defyne  Procedure: The area was prepped thoroughly with hibiclens The area was prepped thoroughly with Puracyn. After introducing the needle into the desired treatment area, the syringe plunger was drawn back to ensure there was no flash of blood prior to injecting the filler in order to minimize risk of intravascular injection and vascular occlusion. After injection of the filler, the treated areas were cleansed and iced to reduce swelling. Post-treatment instructions were reviewed with the patient.       Patient tolerated  the procedure well. The patient will call with any problems, questions or concerns prior to their next appointment.   Return in about 3 months (around 11/18/2020) for cosmetic Botox injections.  Luther Redo, CMA, am acting as scribe for Sarina Ser, MD .  Documentation: I have reviewed the above documentation for accuracy and completeness, and I agree with the above.  Sarina Ser, MD

## 2020-08-20 ENCOUNTER — Encounter: Payer: Self-pay | Admitting: Dermatology

## 2020-09-09 ENCOUNTER — Ambulatory Visit: Payer: Medicare Other | Admitting: Dermatology

## 2020-10-28 ENCOUNTER — Ambulatory Visit: Payer: Medicare Other | Admitting: Dermatology

## 2020-11-03 ENCOUNTER — Other Ambulatory Visit: Payer: Self-pay

## 2020-11-03 ENCOUNTER — Ambulatory Visit (INDEPENDENT_AMBULATORY_CARE_PROVIDER_SITE_OTHER): Payer: Self-pay | Admitting: Dermatology

## 2020-11-03 DIAGNOSIS — L988 Other specified disorders of the skin and subcutaneous tissue: Secondary | ICD-10-CM

## 2020-11-03 NOTE — Progress Notes (Signed)
   Follow-Up Visit   Subjective  Alyssa Webb is a 69 y.o. female who presents for the following: Facial Elastosis (Botox today).  The following portions of the chart were reviewed this encounter and updated as appropriate:   Tobacco  Allergies  Meds  Problems  Med Hx  Surg Hx  Fam Hx     Review of Systems:  No other skin or systemic complaints except as noted in HPI or Assessment and Plan.  Objective  Well appearing patient in no apparent distress; mood and affect are within normal limits.  A focused examination was performed including face. Relevant physical exam findings are noted in the Assessment and Plan.  Objective  Face: Rhytides and volume loss.   Images     Assessment & Plan  Elastosis of skin Face  Botox today Frown complex 27.5 units Chin 5 units  Botox Injection - Face Location: See attached image  Informed consent: Discussed risks (infection, pain, bleeding, bruising, swelling, allergic reaction, paralysis of nearby muscles, eyelid droop, double vision, neck weakness, difficulty breathing, headache, undesirable cosmetic result, and need for additional treatment) and benefits of the procedure, as well as the alternatives.  Informed consent was obtained.  Preparation: The area was cleansed with alcohol.  Procedure Details:  Botox was injected into the dermis with a 30-gauge needle. Pressure applied to any bleeding. Ice packs offered for swelling.  Lot Number:  [id number] Expiration:  01/2023  Total Units Injected:  32.5  Plan: Patient was instructed to remain upright for 4 hours. Patient was instructed to avoid massaging the face and avoid vigorous exercise for the rest of the day. Tylenol may be used for headache.  Allow 2 weeks before returning to clinic for additional dosing as needed. Patient will call for any problems.   Return for Botox in 3-4 months.  I, Ashok Cordia, CMA, am acting as scribe for Sarina Ser, MD .  Documentation: I  have reviewed the above documentation for accuracy and completeness, and I agree with the above.  Sarina Ser, MD

## 2020-11-09 ENCOUNTER — Encounter: Payer: Self-pay | Admitting: Dermatology

## 2021-02-17 ENCOUNTER — Ambulatory Visit: Payer: Medicare Other | Admitting: Dermatology

## 2021-03-23 ENCOUNTER — Ambulatory Visit (INDEPENDENT_AMBULATORY_CARE_PROVIDER_SITE_OTHER): Payer: Self-pay | Admitting: Dermatology

## 2021-03-23 ENCOUNTER — Other Ambulatory Visit: Payer: Self-pay

## 2021-03-23 DIAGNOSIS — L988 Other specified disorders of the skin and subcutaneous tissue: Secondary | ICD-10-CM

## 2021-03-23 NOTE — Progress Notes (Signed)
   Follow-Up Visit   Subjective  Alyssa Webb is a 70 y.o. female who presents for the following: Facial Elastosis (Patient is here today for Botox injections).  The following portions of the chart were reviewed this encounter and updated as appropriate:   Tobacco  Allergies  Meds  Problems  Med Hx  Surg Hx  Fam Hx     Review of Systems:  No other skin or systemic complaints except as noted in HPI or Assessment and Plan.  Objective  Well appearing patient in no apparent distress; mood and affect are within normal limits.  A focused examination was performed including the face. Relevant physical exam findings are noted in the Assessment and Plan.  Objective  Face: Rhytides and volume loss.   Assessment & Plan  Elastosis of skin Face  Botox 32.5 units injected as marked: - Frown complex 27.5 units - Chin 5.0 units  Discussed perioral fillers at follow up appointment in 3-4 months (at the same time as Botox)  Discussed [place] effect of fillers. Discussed Covid booster and risk of filler hardening. Do not recommend having booster close to filler appointment.  Botox Injection - Face Location: See attached image  Informed consent: Discussed risks (infection, pain, bleeding, bruising, swelling, allergic reaction, paralysis of nearby muscles, eyelid droop, double vision, neck weakness, difficulty breathing, headache, undesirable cosmetic result, and need for additional treatment) and benefits of the procedure, as well as the alternatives.  Informed consent was obtained.  Preparation: The area was cleansed with alcohol.  Procedure Details:  Botox was injected into the dermis with a 30-gauge needle. Pressure applied to any bleeding. Ice packs offered for swelling.  Lot Number: [id number] Expiration:  04/2023  Total Units Injected:  32.5  Plan: Patient was instructed to remain upright for 4 hours. Patient was instructed to avoid massaging the face and avoid vigorous  exercise for the rest of the day. Tylenol may be used for headache.  Allow 2 weeks before returning to clinic for additional dosing as needed. Patient will call for any problems.   Return in about 4 months (around 07/23/2021) for Botox injections.  Luther Redo, CMA, am acting as scribe for Sarina Ser, MD .  Documentation: I have reviewed the above documentation for accuracy and completeness, and I agree with the above.  Sarina Ser, MD

## 2021-03-23 NOTE — Patient Instructions (Signed)

## 2021-03-25 ENCOUNTER — Encounter: Payer: Self-pay | Admitting: Dermatology

## 2021-04-15 ENCOUNTER — Encounter: Payer: Medicare Other | Admitting: Dermatology

## 2021-07-21 ENCOUNTER — Other Ambulatory Visit: Payer: Self-pay

## 2021-07-21 ENCOUNTER — Encounter: Payer: Self-pay | Admitting: Dermatology

## 2021-07-21 ENCOUNTER — Ambulatory Visit (INDEPENDENT_AMBULATORY_CARE_PROVIDER_SITE_OTHER): Payer: Self-pay | Admitting: Dermatology

## 2021-07-21 DIAGNOSIS — L988 Other specified disorders of the skin and subcutaneous tissue: Secondary | ICD-10-CM

## 2021-07-21 NOTE — Patient Instructions (Signed)

## 2021-07-21 NOTE — Progress Notes (Signed)
   Follow-Up Visit   Subjective  Alyssa Webb is a 70 y.o. female who presents for the following: Facial Elastosis. Patient is here for Botox and possible fillers today.  The following portions of the chart were reviewed this encounter and updated as appropriate:   Tobacco  Allergies  Meds  Problems  Med Hx  Surg Hx  Fam Hx     Review of Systems:  No other skin or systemic complaints except as noted in HPI or Assessment and Plan.  Objective  Well appearing patient in no apparent distress; mood and affect are within normal limits.  A focused examination was performed including the face. Relevant physical exam findings are noted in the Assessment and Plan.  Face Rhytides and volume loss.                      Assessment & Plan  Elastosis of skin Face  Botox 32.5 units injected as marked:  - Frown complex 27.5 units - Chin 5 units   Restylane Refyne injected into the perioral areas and oral commissures.  1 cc syringe  Lot [id number], exp. date 03/27/2022  Botox Injection - Face Location: See attached image  Informed consent: Discussed risks (infection, pain, bleeding, bruising, swelling, allergic reaction, paralysis of nearby muscles, eyelid droop, double vision, neck weakness, difficulty breathing, headache, undesirable cosmetic result, and need for additional treatment) and benefits of the procedure, as well as the alternatives.  Informed consent was obtained.  Preparation: The area was cleansed with alcohol.  Procedure Details:  Botox was injected into the dermis with a 30-gauge needle. Pressure applied to any bleeding. Ice packs offered for swelling.  Lot Number:  [id number] Expiration:  04/2023  Total Units Injected:  32.5  Plan: Patient was instructed to remain upright for 4 hours. Patient was instructed to avoid massaging the face and avoid vigorous exercise for the rest of the day. Tylenol may be used for headache.  Allow 2 weeks before  returning to clinic for additional dosing as needed. Patient will call for any problems.   Return in about 4 months (around 11/20/2021) for Botox follow up .  Luther Redo, CMA, am acting as scribe for Sarina Ser, MD . Documentation: I have reviewed the above documentation for accuracy and completeness, and I agree with the above.  Sarina Ser, MD

## 2021-11-09 ENCOUNTER — Other Ambulatory Visit: Payer: Self-pay

## 2021-11-09 ENCOUNTER — Ambulatory Visit (INDEPENDENT_AMBULATORY_CARE_PROVIDER_SITE_OTHER): Payer: Self-pay | Admitting: Dermatology

## 2021-11-09 DIAGNOSIS — L988 Other specified disorders of the skin and subcutaneous tissue: Secondary | ICD-10-CM

## 2021-11-09 NOTE — Progress Notes (Signed)
° °  Follow-Up Visit   Subjective  Alyssa Webb is a 70 y.o. female who presents for the following: Facial Elastosis (Patient is here today for Botox injections.).  The following portions of the chart were reviewed this encounter and updated as appropriate:   Tobacco   Allergies   Meds   Problems   Med Hx   Surg Hx   Fam Hx      Review of Systems:  No other skin or systemic complaints except as noted in HPI or Assessment and Plan.  Objective  Well appearing patient in no apparent distress; mood and affect are within normal limits.  A focused examination was performed including the face. Relevant physical exam findings are noted in the Assessment and Plan.  Face Rhytides and volume loss.        Assessment & Plan  Elastosis of skin Face  Botox 32.5 units injected as marked:  - Frown complex 27.5 units - Chin 5 units   Recommend Restylane Refyne or Defyne fillers to the oral commissures and marionette lines at f/u appt for Botox.  Botox Injection - Face Location: See attached image  Informed consent: Discussed risks (infection, pain, bleeding, bruising, swelling, allergic reaction, paralysis of nearby muscles, eyelid droop, double vision, neck weakness, difficulty breathing, headache, undesirable cosmetic result, and need for additional treatment) and benefits of the procedure, as well as the alternatives.  Informed consent was obtained.  Preparation: The area was cleansed with alcohol.  Procedure Details:  Botox was injected into the dermis with a 30-gauge needle. Pressure applied to any bleeding. Ice packs offered for swelling.  Lot Number:  [id number] Expiration:  03/2024  Total Units Injected:  32.5  Plan: Patient was instructed to remain upright for 4 hours. Patient was instructed to avoid massaging the face and avoid vigorous exercise for the rest of the day. Tylenol may be used for headache.  Allow 2 weeks before returning to clinic for additional dosing as needed.  Patient will call for any problems.    Return in about 4 months (around 03/10/2022) for Botox injections.  Luther Redo, CMA, am acting as scribe for Sarina Ser, MD . Documentation: I have reviewed the above documentation for accuracy and completeness, and I agree with the above.  Sarina Ser, MD

## 2021-11-09 NOTE — Patient Instructions (Signed)

## 2021-11-14 ENCOUNTER — Encounter: Payer: Self-pay | Admitting: Dermatology

## 2022-01-11 ENCOUNTER — Other Ambulatory Visit: Payer: Self-pay

## 2022-01-11 ENCOUNTER — Telehealth: Payer: Self-pay

## 2022-01-11 DIAGNOSIS — Z1211 Encounter for screening for malignant neoplasm of colon: Secondary | ICD-10-CM

## 2022-01-11 MED ORDER — PEG 3350-KCL-NA BICARB-NACL 420 G PO SOLR: 4000.0000 mL | Freq: Once | ORAL | 0 refills | Status: AC

## 2022-01-11 NOTE — Progress Notes (Signed)
Gastroenterology Pre-Procedure Review  Request Date: 03/18/2022 Requesting Physician: Dr. Allen Norris  PATIENT REVIEW QUESTIONS: The patient responded to the following health history questions as indicated:    1. Are you having any GI issues? no 2. Do you have a personal history of Polyps? no 3. Do you have a family history of Colon Cancer or Polyps? yes (MOMS SIDE COLON CANCER) 4. Diabetes Mellitus? no 5. Joint replacements in the past 12 months?no 6. Major health problems in the past 3 months?no 7. Any artificial heart valves, MVP, or defibrillator?no    MEDICATIONS & ALLERGIES:    Patient reports the following regarding taking any anticoagulation/antiplatelet therapy:   Plavix, Coumadin, Eliquis, Xarelto, Lovenox, Pradaxa, Brilinta, or Effient? no Aspirin? no  Patient confirms/reports the following medications:  Current Outpatient Medications  Medication Sig Dispense Refill   albuterol (PROVENTIL HFA;VENTOLIN HFA) 108 (90 Base) MCG/ACT inhaler Inhale 2 puffs into the lungs every 4 (four) hours as needed for wheezing or shortness of breath. Dispense with aerochamber 1 Inhaler 0   atorvastatin (LIPITOR) 10 MG tablet Take 10 mg by mouth daily.     levothyroxine (SYNTHROID, LEVOTHROID) 112 MCG tablet Take 112 mcg by mouth daily before breakfast.     Multiple Vitamins-Minerals (WOMENS MULTI) CAPS Take by mouth.     No current facility-administered medications for this visit.    Patient confirms/reports the following allergies:  Allergies  Allergen Reactions   Codeine    Latex     No orders of the defined types were placed in this encounter.   AUTHORIZATION INFORMATION Primary Insurance: 1D#: Group #:  Secondary Insurance: 1D#: Group #:  SCHEDULE INFORMATION: Date: 03/18/2022 Time: Location:[facility]

## 2022-01-11 NOTE — Telephone Encounter (Signed)
DONE

## 2022-02-17 ENCOUNTER — Emergency Department: Payer: Medicare Other

## 2022-02-17 ENCOUNTER — Emergency Department
Admission: EM | Admit: 2022-02-17 | Discharge: 2022-02-17 | Disposition: A | Discharge status: Home / Self Care | Payer: Medicare Other | Attending: Emergency Medicine | Admitting: Emergency Medicine

## 2022-02-17 ENCOUNTER — Other Ambulatory Visit: Payer: Self-pay

## 2022-02-17 ENCOUNTER — Encounter: Payer: Self-pay | Admitting: Emergency Medicine

## 2022-02-17 DIAGNOSIS — K529 Noninfective gastroenteritis and colitis, unspecified: Secondary | ICD-10-CM | POA: Diagnosis not present

## 2022-02-17 DIAGNOSIS — R112 Nausea with vomiting, unspecified: Secondary | ICD-10-CM | POA: Diagnosis present

## 2022-02-17 DIAGNOSIS — E039 Hypothyroidism, unspecified: Secondary | ICD-10-CM | POA: Diagnosis not present

## 2022-02-17 DIAGNOSIS — R109 Unspecified abdominal pain: Secondary | ICD-10-CM

## 2022-02-17 LAB — CBC WITH DIFFERENTIAL/PLATELET
Abs Immature Granulocytes: 0.02 10*3/uL (ref 0.00–0.07)
Basophils Absolute: 0.1 10*3/uL (ref 0.0–0.1)
Basophils Relative: 1 %
Eosinophils Absolute: 0 10*3/uL (ref 0.0–0.5)
Eosinophils Relative: 0 %
HCT: 37.6 % (ref 36.0–46.0)
Hemoglobin: 11.9 g/dL — ABNORMAL LOW (ref 12.0–15.0)
Immature Granulocytes: 0 %
Lymphocytes Relative: 7 %
Lymphs Abs: 0.4 10*3/uL — ABNORMAL LOW (ref 0.7–4.0)
MCH: 28.6 pg (ref 26.0–34.0)
MCHC: 31.6 g/dL (ref 30.0–36.0)
MCV: 90.4 fL (ref 80.0–100.0)
Monocytes Absolute: 0.1 10*3/uL (ref 0.1–1.0)
Monocytes Relative: 1 %
Neutro Abs: 5.8 10*3/uL (ref 1.7–7.7)
Neutrophils Relative %: 91 %
Platelets: 162 10*3/uL (ref 150–400)
RBC: 4.16 MIL/uL (ref 3.87–5.11)
RDW: 13.2 % (ref 11.5–15.5)
WBC: 6.4 10*3/uL (ref 4.0–10.5)
nRBC: 0 % (ref 0.0–0.2)

## 2022-02-17 LAB — COMPREHENSIVE METABOLIC PANEL
ALT: 18 U/L (ref 0–44)
AST: 27 U/L (ref 15–41)
Albumin: 4.3 g/dL (ref 3.5–5.0)
Alkaline Phosphatase: 62 U/L (ref 38–126)
Anion gap: 7 (ref 5–15)
BUN: 24 mg/dL — ABNORMAL HIGH (ref 8–23)
CO2: 26 mmol/L (ref 22–32)
Calcium: 8.8 mg/dL — ABNORMAL LOW (ref 8.9–10.3)
Chloride: 107 mmol/L (ref 98–111)
Creatinine, Ser: 0.66 mg/dL (ref 0.44–1.00)
GFR, Estimated: >60 mL/min (ref >60)
Glucose, Bld: 137 mg/dL — ABNORMAL HIGH (ref 70–99)
Potassium: 3.7 mmol/L (ref 3.5–5.1)
Sodium: 140 mmol/L (ref 135–145)
Total Bilirubin: 0.6 mg/dL (ref 0.3–1.2)
Total Protein: 6.3 g/dL — ABNORMAL LOW (ref 6.5–8.1)

## 2022-02-17 LAB — URINALYSIS, ROUTINE W REFLEX MICROSCOPIC
Bilirubin Urine: NEGATIVE
Glucose, UA: NEGATIVE mg/dL
Hgb urine dipstick: NEGATIVE
Ketones, ur: NEGATIVE mg/dL
Leukocytes,Ua: NEGATIVE
Nitrite: POSITIVE — AB
Protein, ur: NEGATIVE mg/dL
Specific Gravity, Urine: 1.012 (ref 1.005–1.030)
Squamous Epithelial / HPF: NONE SEEN (ref 0–5)
pH: 7 (ref 5.0–8.0)

## 2022-02-17 LAB — LIPASE, BLOOD: Lipase: 43 U/L (ref 11–51)

## 2022-02-17 MED ORDER — KETOROLAC TROMETHAMINE 30 MG/ML IJ SOLN
15.0000 mg | Freq: Once | INTRAMUSCULAR | Status: AC
  Administered 2022-02-17: 15 mg via INTRAVENOUS
  Filled 2022-02-17: qty 1

## 2022-02-17 MED ORDER — LIDOCAINE VISCOUS HCL 2 % MT SOLN: 15.0000 mL | OROMUCOSAL | 0 refills | Status: DC | PRN

## 2022-02-17 MED ORDER — ALUM & MAG HYDROXIDE-SIMETH 200-200-20 MG/5ML PO SUSP
30.0000 mL | Freq: Once | ORAL | Status: AC
  Administered 2022-02-17: 30 mL via ORAL
  Filled 2022-02-17: qty 30

## 2022-02-17 MED ORDER — ONDANSETRON 8 MG PO TBDP: 8.0000 mg | ORAL_TABLET | Freq: Three times a day (TID) | ORAL | 0 refills | Status: DC | PRN

## 2022-02-17 MED ORDER — IOHEXOL 9 MG/ML PO SOLN: 500.0000 mL | ORAL | Status: AC

## 2022-02-17 MED ORDER — LIDOCAINE VISCOUS HCL 2 % MT SOLN
15.0000 mL | Freq: Once | OROMUCOSAL | Status: AC
  Administered 2022-02-17: 15 mL via ORAL
  Filled 2022-02-17: qty 15

## 2022-02-17 MED ORDER — IOHEXOL 300 MG/ML  SOLN
100.0000 mL | Freq: Once | INTRAMUSCULAR | Status: AC | PRN
  Administered 2022-02-17: 100 mL via INTRAVENOUS

## 2022-02-17 NOTE — ED Provider Notes (Signed)
[facility] Provider Note   Event Date/Time   First MD Initiated Contact with Patient 02/17/22 [phone]     (approximate) History  Back Pain and Emesis  HPI Alyssa Webb is a 71 y.o. female with a stated past medical history of hypothyroidism and hyperlipidemia who presents for lower left flank pain that radiates into her lower abdomen and is associated with vomiting that began last night at 1 AM with associated dysuria.  Patient was offered fentanyl and Zofran by EMS but declined stating "I saw what it did to my daughter".  Patient denies any history of kidney stones or pain similar to this in the past. Physical Exam  Triage Vital Signs: ED Triage Vitals  Enc Vitals Group     BP      Pulse      Resp      Temp      Temp src      SpO2      Weight      Height      Head Circumference      Peak Flow      Pain Score      Pain Loc      Pain Edu?      Excl. in GC?    Most recent vital signs: Vitals:   02/17/22 1415 02/17/22 1500  BP:  (!) 146/70  Pulse: 83 79  Resp: 11 14  Temp:    SpO2: 97% 100%   General: Awake, oriented x4. CV:  Good peripheral perfusion.  Resp:  Normal effort.  Abd:  No distention.  Other:  Elderly Caucasian female laying in bed restless secondary to pain ED Results / Procedures / Treatments  Labs (all labs ordered are listed, but only abnormal results are displayed) Labs Reviewed  COMPREHENSIVE METABOLIC PANEL - Abnormal; Notable for the following components:      Result Value   Glucose, Bld 137 (*)    BUN 24 (*)    Calcium 8.8 (*)    Total Protein 6.3 (*)    All other components within normal limits  CBC WITH DIFFERENTIAL/PLATELET - Abnormal; Notable for the following components:   Hemoglobin 11.9 (*)    Lymphs Abs 0.4 (*)    All other components within normal limits  URINALYSIS, ROUTINE W REFLEX MICROSCOPIC - Abnormal; Notable for the following components:   Color, Urine YELLOW (*)    APPearance CLOUDY (*)     Nitrite POSITIVE (*)    Bacteria, UA RARE (*)    All other components within normal limits  LIPASE, BLOOD   EKG ED ECG REPORT I, Merwyn Katos, the attending physician, personally viewed and interpreted this ECG. Date: 02/17/2022 EKG Time: 1414 Rate: 80 Rhythm: normal sinus rhythm QRS Axis: normal Intervals: normal ST/T Wave abnormalities: normal Narrative Interpretation: no evidence of acute ischemia RADIOLOGY ED MD interpretation: CT renal stone study shows collection of bowel loops in the left upper quadrant raising the question of nonobstructive internal hernia or simply small intestine shifted up to the left upper quadrant.  There is also evidence of possible gastritis versus duodenitis Repeat CT of the abdomen pelvis with oral and IV contrast shows no evidence of internal hernia or bowel obstruction with evidence of gastritis/enteritis -Agree with radiology assessment Official radiology report(s): CT Renal Stone Study  Result Date: 02/17/2022 CLINICAL DATA:  A 71 year old female presents for evaluation of left flank pain that started at midnight and radiates to the lower abdomen. EXAM:  CT ABDOMEN AND PELVIS WITHOUT CONTRAST TECHNIQUE: Multidetector CT imaging of the abdomen and pelvis was performed following the standard protocol without IV contrast. RADIATION DOSE REDUCTION: This exam was performed according to the departmental dose-optimization program which includes automated exposure control, adjustment of the mA and/or kV according to patient size and/or use of iterative reconstruction technique. COMPARISON:  None FINDINGS: Lower chest: No effusion. No consolidation. Signs of prior granulomatous disease about the LEFT hilum. Hepatobiliary: Liver with smooth contours. No visible lesion on noncontrast imaging. No signs of pericholecystic stranding. Pancreas: Smooth pancreatic contours, no signs of inflammation. Spleen: Granulomatous changes in the normal size spleen. Adrenals/Urinary  Tract: Adrenal glands are normal. Smooth renal contours. No perinephric stranding. No hydronephrosis. Paucity of retroperitoneal and intra-abdominal fat limiting assessment of the ureters. No visible ureteral calculi. Nonobstructing RIGHT lower pole calculus at 3 mm. No LEFT renal calculi. No perivesical stranding. Urinary bladder with limited assessment due to under distension. Stomach/Bowel: Colonic diverticulosis. No pericolonic stranding. Stool throughout the colon without substantial distension of the colon. Normal appendix. Stomach under distended. Question of stranding versus motion artifact about the distal stomach and proximal duodenum. Bowel is nondilated. There is distortion of mesenteric structures and a collection of bowel loops without signs of obstruction in the LEFT upper quadrant and without visible mesenteric twist. Vascular/Lymphatic: Aortic atherosclerosis. No sign of aneurysm. Smooth contour of the IVC. There is no gastrohepatic or hepatoduodenal ligament lymphadenopathy. No retroperitoneal or mesenteric lymphadenopathy. No pelvic sidewall lymphadenopathy. Limited assessment of vascular structures in the absence of intravenous contrast. Reproductive: Unremarkable by CT, no adnexal masses. Other: No pneumoperitoneum.  No ascites. Musculoskeletal: No acute bone finding. No destructive bone process. Spinal degenerative changes. IMPRESSION: 1. Collection of bowel loops in the LEFT upper quadrant raising the question of nonobstructing internal hernia, alternatively this could represent bowel shifted into the LEFT upper quadrant. Correlate with symptoms and consider study with enteric contrast as warranted for further evaluation. 2. Question of stranding versus motion artifact about the distal stomach and proximal duodenum. Correlate with any symptoms of gastritis/duodenitis. 3. Nonobstructing 3 mm RIGHT lower pole calculus. 4. Colonic diverticulosis without evidence of acute diverticulitis. 5. Aortic  atherosclerosis. 6. Signs of prior granulomatous disease about the LEFT hilum and spleen. Aortic Atherosclerosis (ICD10-I70.0). Electronically Signed   By: Donzetta Kohut M.D.   On: 02/17/2022 09:07   PROCEDURES: Critical Care performed: No Procedures MEDICATIONS ORDERED IN ED: Medications  iohexol (OMNIPAQUE) 9 MG/ML oral solution 500 mL (has no administration in time range)  ketorolac (TORADOL) 30 MG/ML injection 15 mg (15 mg Intravenous Given 02/17/22 0811)  iohexol (OMNIPAQUE) 300 MG/ML solution 100 mL (100 mLs Intravenous Contrast Given 02/17/22 1142)  alum & mag hydroxide-simeth (MAALOX/MYLANTA) 200-200-20 MG/5ML suspension 30 mL (30 mLs Oral Given 02/17/22 1505)    And  lidocaine (XYLOCAINE) 2 % viscous mouth solution 15 mL (15 mLs Oral Given 02/17/22 1505)   IMPRESSION / MDM / ASSESSMENT AND PLAN / ED COURSE  I reviewed the triage vital signs and the nursing notes.                             The patient is on the cardiac monitor to evaluate for evidence of arrhythmia and/or significant heart rate changes. Patient presents for acute nausea/vomiting The cause of the patients symptoms is not clear, but the patient is overall well appearing and is suspected to have a transient course of illness.  Given History  and Exam there does not appear to be an emergent cause of the symptoms such as small bowel obstruction, coronary syndrome, bowel ischemia, DKA, pancreatitis, appendicitis, other acute abdomen or other emergent problem.  Reassessment: After treatment, the patient is feeling much better, tolerating PO fluids, and shows no signs of dehydration.   Disposition: Discharge home with prompt primary care physician follow up in the next 48 hours. Strict return precautions discussed.    FINAL CLINICAL IMPRESSION(S) / ED DIAGNOSES   Final diagnoses:  Gastroenteritis  Abdominal pain, unspecified abdominal location  Nausea and vomiting, unspecified vomiting type   Rx / DC Orders   ED  Discharge Orders          Ordered    ondansetron (ZOFRAN-ODT) 8 MG disintegrating tablet  Every 8 hours PRN        02/17/22 1443           Note:  This document was prepared using Dragon voice recognition software and may include unintentional dictation errors.   Merwyn Katos, MD 02/17/22 [phone]

## 2022-02-17 NOTE — ED Provider Notes (Signed)
----------------------------------------- ?  3:14 PM on 02/17/2022 ?----------------------------------------- ? ?Blood pressure (!) 146/70, pulse 79, temperature 98.8 ?F (37.1 ?C), temperature source Oral, resp. rate 14, height '5\' 4"'$  (1.626 m), weight 56.7 kg, SpO2 100 %. ? ?Assuming care from Dr. Cheri Fowler.  In short, [place] is a 70 y.o. female with a chief complaint of Back Pain and Emesis ?Marland Kitchen  Refer to the original H&P for additional details. ? ?The current plan of care is to reassess following PO challenge, appropriate for discharge home if able to tolerate oral intake. ? ?----------------------------------------- ?4:47 PM on 02/17/2022 ?----------------------------------------- ?On reassessment, patient reports she is feeling much better and was able to tolerate a can of soda without difficulty.  She is appropriate for discharge home with PCP follow-up, is requesting prescription for viscous lidocaine to take as needed.  She was counseled to return to the ED for new or worsening symptoms, patient agrees with plan. ? ?  ?Blake Divine, MD ?02/17/22 1648 ? ?

## 2022-02-17 NOTE — ED Notes (Signed)
Patient transported to CT 

## 2022-02-17 NOTE — ED Notes (Signed)
Pt given crackers and diet sprite per EDP.  ?

## 2022-02-17 NOTE — ED Triage Notes (Signed)
Lower left flank pain started at midnight, radiates to lower abd, vomiting started around 1am, pain with urination.  ?

## 2022-02-17 NOTE — ED Notes (Signed)
Pt with c/o chest pain pt states since 9 am, pt states it is due to the bed. EDP notified, EKG completed.  ?

## 2022-02-17 NOTE — ED Notes (Signed)
Dc instructions and scripts reviewed with pt no questions or concerns at this time. Pt wheeled out to vehicle to be transported home by daughter.  ?

## 2022-02-21 ENCOUNTER — Telehealth: Payer: Self-pay | Admitting: Gastroenterology

## 2022-02-21 NOTE — Telephone Encounter (Signed)
Patient called again regarding last telephone call note. "Patient states she was in the ER on Thursday 02/17/2022. Patient states she has a bowel blockage and is in a lot of pain. Requesting sooner colonoscopy date and call back as soon as possible" ?

## 2022-02-21 NOTE — Telephone Encounter (Signed)
Patient states she was in the ER on Thursday 02/17/2022. Patient states she has a bowel blockage and is in a lot of pain. Requesting sooner colonoscopy date and call back as soon as possible. ?

## 2022-02-23 NOTE — Telephone Encounter (Signed)
We had a cancellation procedure moved to 4/4 ?

## 2022-03-01 ENCOUNTER — Ambulatory Visit: Payer: Medicare Other | Admitting: Anesthesiology

## 2022-03-01 ENCOUNTER — Ambulatory Visit
Admission: RE | Admit: 2022-03-01 | Discharge: 2022-03-01 | Disposition: A | Discharge status: Home / Self Care | Payer: Medicare Other | Attending: Gastroenterology | Admitting: Gastroenterology

## 2022-03-01 ENCOUNTER — Encounter
Admission: RE | Disposition: A | Discharge status: Home / Self Care | Payer: Self-pay | Source: Home / Self Care | Attending: Gastroenterology

## 2022-03-01 ENCOUNTER — Encounter: Payer: Self-pay | Admitting: Gastroenterology

## 2022-03-01 DIAGNOSIS — Z1211 Encounter for screening for malignant neoplasm of colon: Secondary | ICD-10-CM | POA: Diagnosis present

## 2022-03-01 DIAGNOSIS — E039 Hypothyroidism, unspecified: Secondary | ICD-10-CM | POA: Diagnosis not present

## 2022-03-01 DIAGNOSIS — K640 First degree hemorrhoids: Secondary | ICD-10-CM | POA: Insufficient documentation

## 2022-03-01 DIAGNOSIS — K648 Other hemorrhoids: Secondary | ICD-10-CM | POA: Insufficient documentation

## 2022-03-01 HISTORY — PX: COLONOSCOPY WITH PROPOFOL: SHX5780

## 2022-03-01 SURGERY — COLONOSCOPY WITH PROPOFOL
Anesthesia: General

## 2022-03-01 MED ORDER — SODIUM CHLORIDE 0.9 % IV SOLN: INTRAVENOUS | Status: DC

## 2022-03-01 MED ORDER — PROPOFOL 10 MG/ML IV BOLUS
INTRAVENOUS | Status: DC | PRN
  Administered 2022-03-01: 70 mg via INTRAVENOUS

## 2022-03-01 MED ORDER — LIDOCAINE HCL (CARDIAC) PF 100 MG/5ML IV SOSY
PREFILLED_SYRINGE | INTRAVENOUS | Status: DC | PRN
Start: 2022-03-01 — End: 2022-03-01
  Administered 2022-03-01: 50 mg via INTRAVENOUS

## 2022-03-01 MED ORDER — PROPOFOL 500 MG/50ML IV EMUL
INTRAVENOUS | Status: DC | PRN
  Administered 2022-03-01: 140 ug/kg/min via INTRAVENOUS

## 2022-03-01 NOTE — Anesthesia Postprocedure Evaluation (Signed)
Anesthesia Post Note ? ?Patient: Alyssa Webb ? ?Procedure(s) Performed: COLONOSCOPY WITH PROPOFOL ? ?Patient location during evaluation: Endoscopy ?Anesthesia Type: General ?Level of consciousness: awake and alert ?Pain management: pain level controlled ?Vital Signs Assessment: post-procedure vital signs reviewed and stable ?Respiratory status: spontaneous breathing, nonlabored ventilation and respiratory function stable ?Cardiovascular status: blood pressure returned to baseline and stable ?Postop Assessment: no apparent nausea or vomiting ?Anesthetic complications: no ? ? ?No notable events documented. ? ? ?Last Vitals:  ?Vitals:  ? 03/01/22 1055 03/01/22 1105  ?BP: 118/73 (!) 120/95  ?Pulse: 86 80  ?Resp: 14 14  ?Temp:    ?SpO2: 98% 98%  ?  ?Last Pain:  ?Vitals:  ? 03/01/22 1105  ?TempSrc:   ?PainSc: 0-No pain  ? ? ?  ?  ?  ?  ?  ?  ? ?[place] Ouch ? ? ? ? ?

## 2022-03-01 NOTE — Anesthesia Preprocedure Evaluation (Addendum)
Anesthesia Evaluation  ?Patient identified by MRN, date of birth, ID band ?Patient awake ? ? ? ?Reviewed: ?Allergy & Precautions, NPO status , Patient's Chart, lab work & pertinent test results ? ?Airway ?Mallampati: II ? ?TM Distance: >3 FB ?Neck ROM: full ? ? ? Dental ?no notable dental hx. ? ?  ?Pulmonary ?neg pulmonary ROS,  ?  ?Pulmonary exam normal ? ? ? ? ? ? ? Cardiovascular ?negative cardio ROS ?Normal cardiovascular exam ? ? ?  ?Neuro/Psych ?negative neurological ROS ? negative psych ROS  ? GI/Hepatic ?negative GI ROS, Neg liver ROS,   ?Endo/Other  ?Hypothyroidism  ? Renal/GU ?negative Renal ROS  ?negative genitourinary ?  ?Musculoskeletal ? ? Abdominal ?Normal abdominal exam  (+)   ?Peds ? Hematology ?negative hematology ROS ?(+)   ?Anesthesia Other Findings ?Pt reports falling recently with a delayed onset of numbness in her anterior perineum and left upper thigh. Denies weakness or incontinence. Will visit PCP soon to discuss.  ? ?Past Medical History: ?No date: Actinic keratosis ?No date: Hyperlipidemia ?No date: Hypothyroidism ? ?Past Surgical History: ?No date: CARPAL TUNNEL RELEASE ? ? ? ? Reproductive/Obstetrics ?negative OB ROS ? ?  ? ? ? ? ? ? ? ? ? ? ? ? ? ?  ?  ? ? ? ? ? ? ? ?Anesthesia Physical ?Anesthesia Plan ? ?ASA: 2 ? ?Anesthesia Plan: General  ? ?Post-op Pain Management:   ? ?Induction: Intravenous ? ?PONV Risk Score and Plan: Propofol infusion and TIVA ? ?Airway Management Planned: Natural Airway ? ?Additional Equipment:  ? ?Intra-op Plan:  ? ?Post-operative Plan:  ? ?Informed Consent: I have reviewed the patients History and Physical, chart, labs and discussed the procedure including the risks, benefits and alternatives for the proposed anesthesia with the patient or authorized representative who has indicated his/her understanding and acceptance.  ? ? ? ?Dental advisory given ? ?Plan Discussed with: Anesthesiologist, CRNA and Surgeon ? ?Anesthesia  Plan Comments:   ? ? ? ? ? ?Anesthesia Quick Evaluation ? ?

## 2022-03-01 NOTE — Op Note (Signed)
[facility] ?Gastroenterology ?Patient Name: Alyssa Webb ?Procedure Date: 03/01/2022 10:17 AM ?MRN: [id number] ?Account #: [id number] ?Date of Birth: February 05, 1951 ?Admit Type: Outpatient ?Age: 71 ?Room: [facility] ENDO ROOM 4 ?Gender: Female ?Note Status: Finalized ?Instrument Name: Colonoscope [id number] ?Procedure:             Colonoscopy ?Indications:           Screening for colorectal malignant neoplasm ?Providers:             [name] MD, MD ?Referring MD:          Lenard Simmer, MD (Referring MD) ?Medicines:             Propofol per Anesthesia ?Complications:         No immediate complications. ?Procedure:             Pre-Anesthesia Assessment: ?                       - Prior to the procedure, a History and Physical was  ?                       performed, and patient medications and allergies were  ?                       reviewed. The patient's tolerance of previous  ?                       anesthesia was also reviewed. The risks and benefits  ?                       of the procedure and the sedation options and risks  ?                       were discussed with the patient. All questions were  ?                       answered, and informed consent was obtained. Prior  ?                       Anticoagulants: The patient has taken no previous  ?                       anticoagulant or antiplatelet agents. ASA Grade  ?                       Assessment: II - A patient with mild systemic disease.  ?                       After reviewing the risks and benefits, the patient  ?                       was deemed in satisfactory condition to undergo the  ?                       procedure. ?                       After obtaining informed consent, the colonoscope was  ?  passed under direct vision. Throughout the procedure,  ?                       the patient's blood pressure, pulse, and oxygen  ?                       saturations were monitored continuously. The  ?                        Colonoscope was introduced through the anus and  ?                       advanced to the the cecum, identified by  ?                       transillumination. The colonoscopy was performed  ?                       without difficulty. The patient tolerated the  ?                       procedure well. The quality of the bowel preparation  ?                       was excellent. ?Findings: ?     The perianal and digital rectal examinations were normal. ?     Non-bleeding internal hemorrhoids were found during retroflexion. The  ?     hemorrhoids were Grade I (internal hemorrhoids that do not prolapse). ?Impression:            - Non-bleeding internal hemorrhoids. ?                       - No specimens collected. ?Recommendation:        - Discharge patient to home. ?                       - Resume previous diet. ?                       - Continue present medications. ?                       - Repeat colonoscopy is not recommended for screening  ?                       purposes. ?Procedure Code(s):     --- Professional --- ?                       [phone], Colonoscopy, flexible; diagnostic, including  ?                       collection of specimen(s) by brushing or washing, when  ?                       performed (separate procedure) ?Diagnosis Code(s):     --- Professional --- ?                       Z12.11, Encounter for screening for malignant neoplasm  ?  of colon ?CPT copyright 2019 American Medical Association. All rights reserved. ?The codes documented in this report are preliminary and upon coder review may  ?be revised to meet current compliance requirements. ?[name] MD, MD ?03/01/2022 10:45:47 AM ?This report has been signed electronically. ?Number of Addenda: 0 ?Note Initiated On: 03/01/2022 10:17 AM ?Scope Withdrawal Time: 0 hours 6 minutes 13 seconds  ?Total Procedure Duration: 0 hours 15 minutes 53 seconds  ?Estimated Blood Loss:  Estimated blood loss: none. ?     [facility] ?

## 2022-03-01 NOTE — H&P (Signed)
? ?Alyssa Lame, MD [facility] ?[address] ?[place], [place] [address] ?Phone: [phone] ?Fax : [phone] ? ?Primary Care Physician:  Lenard Simmer, MD ?Primary Gastroenterologist:  Dr. Allen Norris ? ?Pre-Procedure History & Physical: ?HPI:  Alyssa Webb is a 71 y.o. female is here for a screening colonoscopy.  ? ?Past Medical History:  ?Diagnosis Date  ? Actinic keratosis   ? Hyperlipidemia   ? Hypothyroidism   ? ? ?Past Surgical History:  ?Procedure Laterality Date  ? CARPAL TUNNEL RELEASE    ? ? ?Prior to Admission medications   ?Medication Sig Start Date End Date Taking? Authorizing Provider  ?levothyroxine (SYNTHROID, LEVOTHROID) 112 MCG tablet Take 112 mcg by mouth daily before breakfast.   Yes [provider]  ?Vitamin D, Ergocalciferol, (DRISDOL) 1.25 MG (50000 UNIT) CAPS capsule Take 50,000 Units by mouth once a week. 01/28/22  Yes [provider]  ?atorvastatin (LIPITOR) 10 MG tablet Take 5 mg by mouth daily.    [provider]  ?lidocaine (XYLOCAINE) 2 % solution Use as directed 15 mLs in the mouth or throat as needed for mouth pain. ?Patient not taking: Reported on 03/01/2022 02/17/22   Blake Divine, MD  ?meloxicam (MOBIC) 15 MG tablet Take 15 mg by mouth daily. 02/16/22   [provider]  ?Multiple Vitamins-Minerals (WOMENS MULTI) CAPS Take by mouth.    [provider]  ?ondansetron (ZOFRAN-ODT) 8 MG disintegrating tablet Take 1 tablet (8 mg total) by mouth every 8 (eight) hours as needed for nausea or vomiting. ?Patient not taking: Reported on 03/01/2022 02/17/22   Naaman Plummer, MD  ? ? ?Allergies as of 01/11/2022 - Review Complete 01/11/2022  ?Allergen Reaction Noted  ? Codeine  08/02/2015  ? Latex  04/15/2020  ? ? ?Family History  ?Problem Relation Age of Onset  ? COPD Mother   ? Lung cancer Mother   ? ? ?Social History  ? ?Socioeconomic History  ? Marital status: Married  ?  Spouse name: Not on file  ? Number of children: Not on file  ? Years of  education: Not on file  ? Highest education level: Not on file  ?Occupational History  ? Not on file  ?Tobacco Use  ? Smoking status: Never  ? Smokeless tobacco: Never  ?Vaping Use  ? Vaping Use: Never used  ?Substance and Sexual Activity  ? Alcohol use: No  ?  Alcohol/week: 0.0 standard drinks  ? Drug use: Never  ? Sexual activity: Yes  ?Other Topics Concern  ? Not on file  ?Social History Narrative  ? Not on file  ? ?Social Determinants of Health  ? ?Financial Resource Strain: Not on file  ?Food Insecurity: Not on file  ?Transportation Needs: Not on file  ?Physical Activity: Not on file  ?Stress: Not on file  ?Social Connections: Not on file  ?Intimate Partner Violence: Not on file  ? ? ?Review of Systems: ?See HPI, otherwise negative ROS ? ?Physical Exam: ?BP 111/66   Pulse 94   Temp (!) 96.8 ?F (36 ?C) (Temporal)   Resp 16   Ht '5\' 3"'$  (1.6 m)   Wt 53.5 kg   SpO2 100%   BMI 20.90 kg/m?  ?General:   Alert,  pleasant and cooperative in NAD ?Head:  Normocephalic and atraumatic. ?Neck:  Supple; no masses or thyromegaly. ?Lungs:  Clear throughout to auscultation.    ?Heart:  Regular rate and rhythm. ?Abdomen:  Soft, nontender and nondistended. Normal bowel sounds, without guarding, and  without rebound.   ?Neurologic:  Alert and  oriented x4;  grossly normal neurologically. ? ?Impression/Plan: ?[place] is now here to undergo a screening colonoscopy. ? ?Risks, benefits, and alternatives regarding colonoscopy have been reviewed with the patient.  Questions have been answered.  All parties agreeable. ?

## 2022-03-01 NOTE — Transfer of Care (Signed)
Immediate Anesthesia Transfer of Care Note ? ?Patient: Alyssa Webb ? ?Procedure(s) Performed: COLONOSCOPY WITH PROPOFOL ? ?Patient Location: PACU ? ?Anesthesia Type:General ? ?Level of Consciousness: awake, alert  and oriented ? ?Airway & Oxygen Therapy: Patient Spontanous Breathing ? ?Post-op Assessment: Report given to RN and Post -op Vital signs reviewed and stable ? ?Post vital signs: Reviewed and stable ? ?Last Vitals:  ?Vitals Value Taken Time  ?BP    ?Temp    ?Pulse    ?Resp    ?SpO2    ? ? ?Last Pain:  ?Vitals:  ? 03/01/22 0933  ?TempSrc: Temporal  ?PainSc: 2   ?   ? ?  ? ?Complications: No notable events documented. ?

## 2022-03-02 ENCOUNTER — Ambulatory Visit (INDEPENDENT_AMBULATORY_CARE_PROVIDER_SITE_OTHER): Payer: Self-pay | Admitting: Dermatology

## 2022-03-02 DIAGNOSIS — L988 Other specified disorders of the skin and subcutaneous tissue: Secondary | ICD-10-CM

## 2022-03-02 NOTE — Progress Notes (Signed)
? ?  Follow-Up Visit ?  ?Subjective  ?Alyssa Webb is a 71 y.o. 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 ? ?The following portions of the chart were reviewed this encounter and updated as appropriate:  ? Tobacco  Allergies  Meds  Problems  Med Hx  Surg Hx  Fam Hx   ?  ?Review of Systems:  No other skin or systemic complaints except as noted in HPI or Assessment and Plan. ? ?Objective  ?Well appearing patient in no apparent distress; mood and affect are within normal limits. ? ?A focused examination was performed including face. Relevant physical exam findings are noted in the Assessment and Plan. ? ?Face ?Rhytides and volume loss.  ? ? ? ? ? ?Assessment & Plan  ?Elastosis of skin ?Face ? ?Botox Injection - Face ?Location: See attached image ? ?Informed consent: Discussed risks (infection, pain, bleeding, bruising, swelling, allergic reaction, paralysis of nearby muscles, eyelid droop, double vision, neck weakness, difficulty breathing, headache, undesirable cosmetic result, and need for additional treatment) and benefits of the procedure, as well as the alternatives.  Informed consent was obtained. ? ?Preparation: The area was cleansed with alcohol. ? ?Procedure Details:  Botox was injected into the dermis with a 30-gauge needle. Pressure applied to any bleeding. Ice packs offered for swelling. ? ?Lot Number:  [id number] ?Expiration:  02/2024 ? ?Total Units Injected:  32.5 ? ?Plan: Patient was instructed to remain upright for 4 hours. Patient was instructed to avoid massaging the face and avoid vigorous exercise for the rest of the day. Tylenol may be used for headache.  Allow 2 weeks before returning to clinic for additional dosing as needed. Patient will call for any problems. ? ?Return for Botox in 3-4 months. ? ?I, Ashok Cordia, CMA, am acting as scribe for Sarina Ser, MD . ?Documentation: I have reviewed the above documentation for accuracy and  completeness, and I agree with the above. ? ?Sarina Ser, MD ? ?

## 2022-03-02 NOTE — Patient Instructions (Signed)

## 2022-03-03 ENCOUNTER — Encounter: Payer: Self-pay | Admitting: Dermatology

## 2022-06-16 IMAGING — CT CT RENAL STONE PROTOCOL
2 of 4 series · 15 of 46 positions shown, 17 images · non-contrast
Comparison: None

CLINICAL DATA: A 70-year-old female presents for evaluation of left
flank pain that started at midnight and radiates to the lower
abdomen.



[Series 2: ap without · axial · non-contrast · 0.67mm/px · z∈[-843,-438]mm · 12 of 93 slices shown, 14 images]
[im 6/93  soft-tissue]
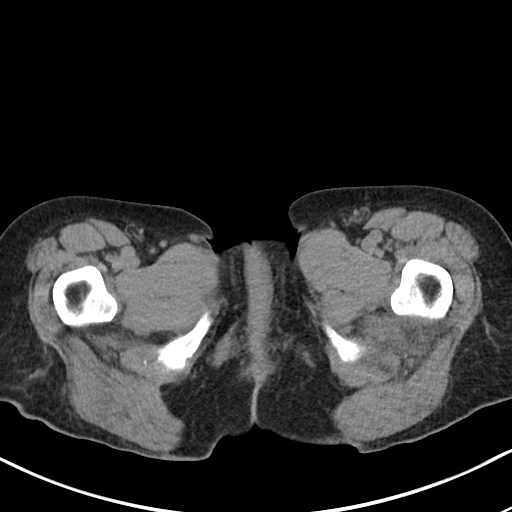
[im 6/93  bone]
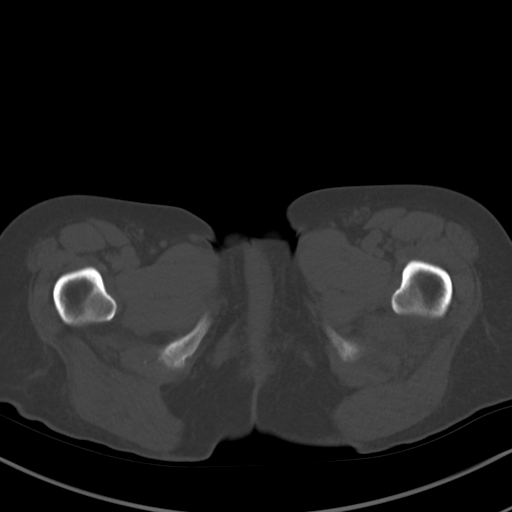
[im 16/93  soft-tissue]
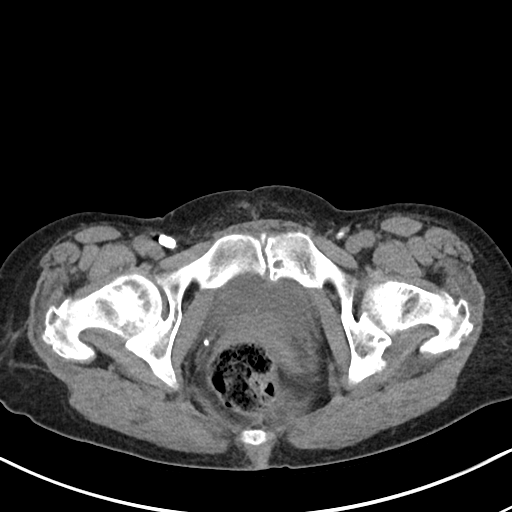
[im 21/93  soft-tissue]
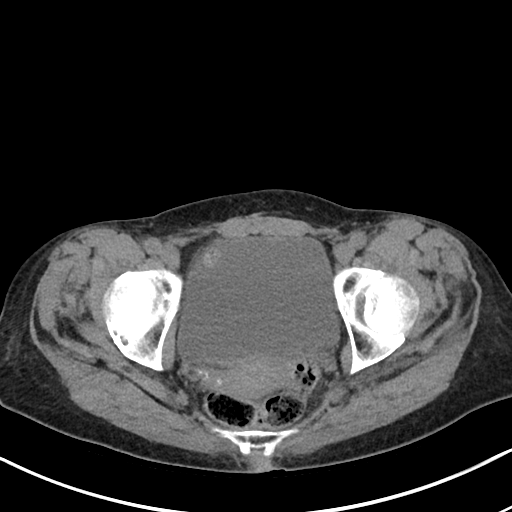
[im 26/93  soft-tissue]
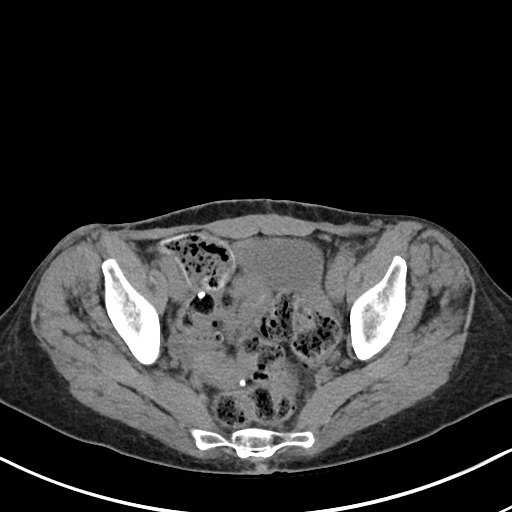
[im 36/93  soft-tissue]
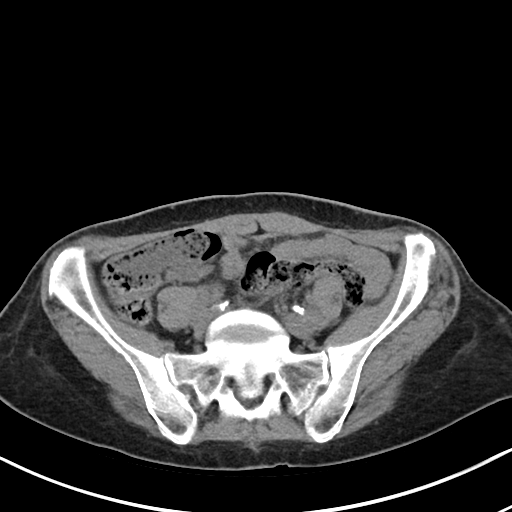
[im 41/93  soft-tissue]
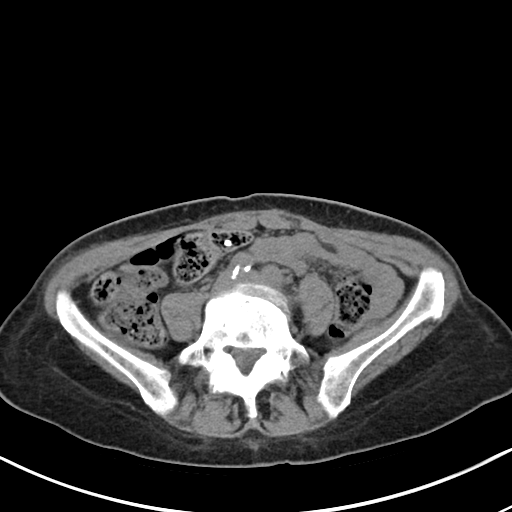
[im 52/93  soft-tissue]
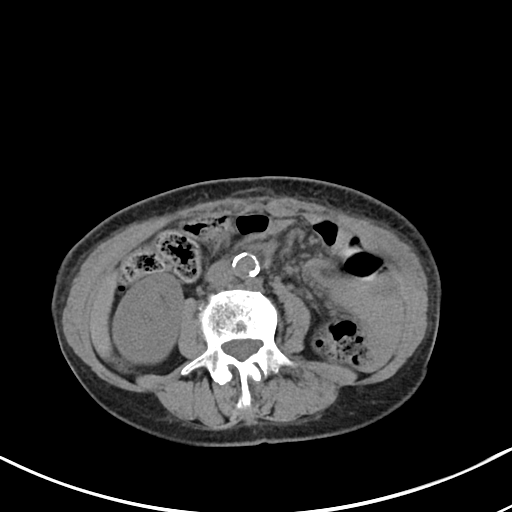
[im 57/93  soft-tissue]
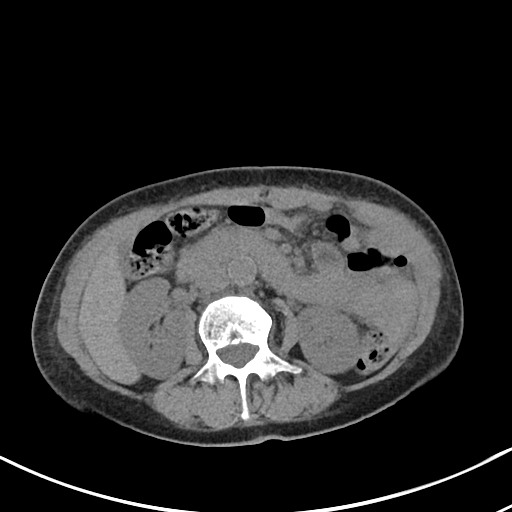
[im 67/93  soft-tissue]
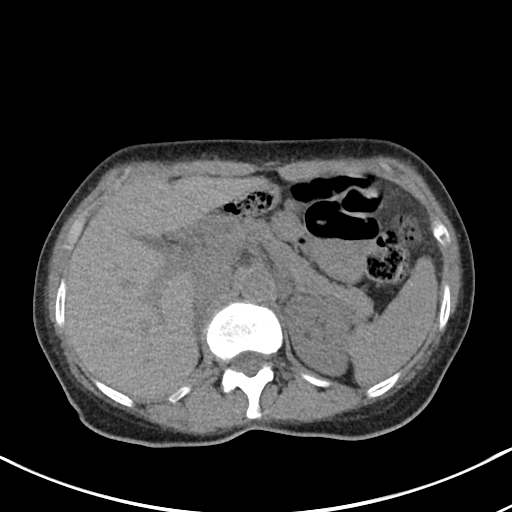
[im 67/93  bone]
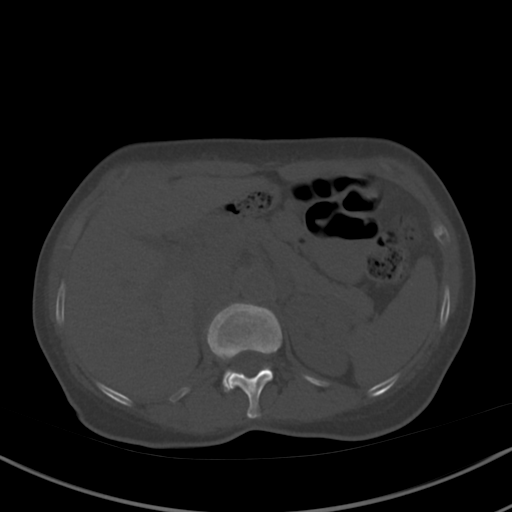
[im 72/93  soft-tissue]
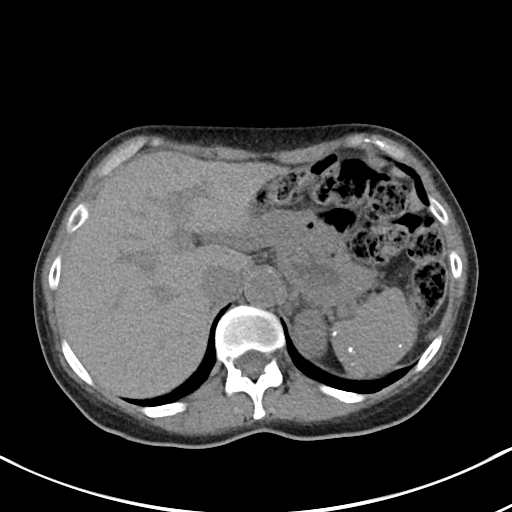
[im 77/93  soft-tissue]
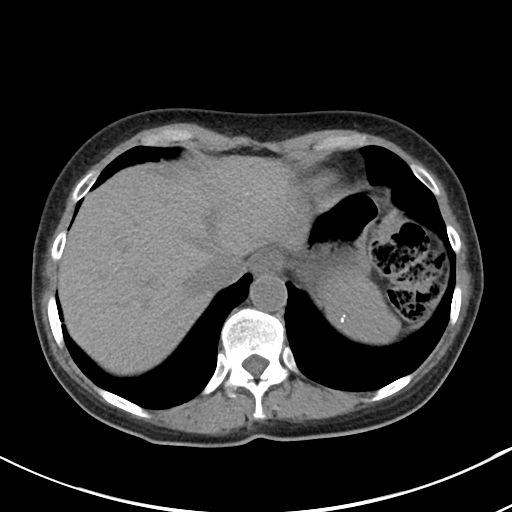
[im 87/93  soft-tissue]
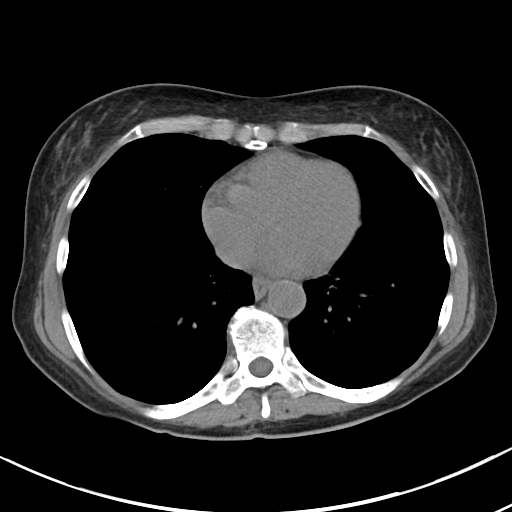

[Series 5: cor · coronal · 0.69mm/px · 3 of 80 slices shown]
[im 27/80  soft-tissue]
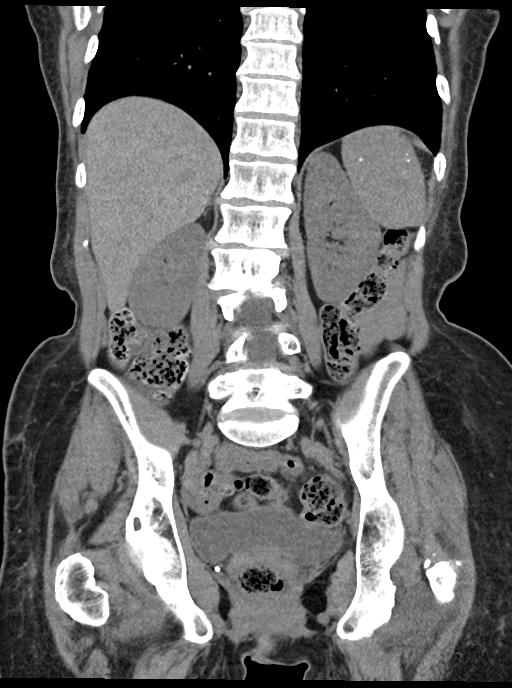
[im 36/80  soft-tissue]
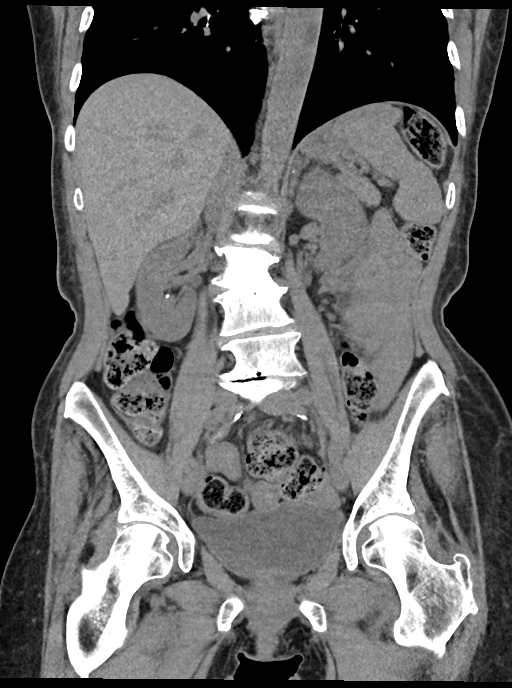
[im 44/80  soft-tissue]
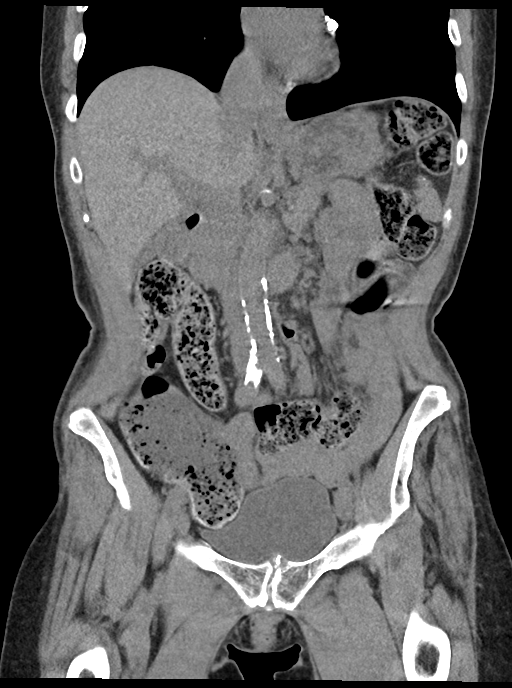

[15 of 46 positions shown; findings below may reference images not displayed]

FINDINGS: Lower chest: No effusion. No consolidation. Signs of prior
granulomatous disease about the LEFT hilum.

Hepatobiliary: Liver with smooth contours. No visible lesion on
noncontrast imaging. No signs of pericholecystic stranding.

Pancreas: Smooth pancreatic contours, no signs of inflammation.

Spleen: Granulomatous changes in the normal size spleen.

Adrenals/Urinary Tract: Adrenal glands are normal.

Smooth renal contours. No perinephric stranding. No hydronephrosis.

Paucity of retroperitoneal and intra-abdominal fat limiting
assessment of the ureters. No visible ureteral calculi.

Nonobstructing RIGHT lower pole calculus at 3 mm. No LEFT renal
calculi.

No perivesical stranding. Urinary bladder with limited assessment
due to under distension.

Stomach/Bowel: Colonic diverticulosis. No pericolonic stranding.
Stool throughout the colon without substantial distension of the
colon. Normal appendix.

Stomach under distended. Question of stranding versus motion
artifact about the distal stomach and proximal duodenum. Bowel is
nondilated. There is distortion of mesenteric structures and a
collection of bowel loops without signs of obstruction in the LEFT
upper quadrant and without visible mesenteric twist.

Vascular/Lymphatic:

Aortic atherosclerosis. No sign of aneurysm. Smooth contour of the
IVC. There is no gastrohepatic or hepatoduodenal ligament
lymphadenopathy. No retroperitoneal or mesenteric lymphadenopathy.

No pelvic sidewall lymphadenopathy.

Limited assessment of vascular structures in the absence of
intravenous contrast.

Reproductive: Unremarkable by CT, no adnexal masses.

Other: No pneumoperitoneum.  No ascites.

Musculoskeletal: No acute bone finding. No destructive bone process.
Spinal degenerative changes.
IMPRESSION: 1. Collection of bowel loops in the LEFT upper quadrant raising the
question of nonobstructing internal hernia, alternatively this could
represent bowel shifted into the LEFT upper quadrant. Correlate with
symptoms and consider study with enteric contrast as warranted for
further evaluation.
2. Question of stranding versus motion artifact about the distal
stomach and proximal duodenum. Correlate with any symptoms of
gastritis/duodenitis.
3. Nonobstructing 3 mm RIGHT lower pole calculus.
4. Colonic diverticulosis without evidence of acute diverticulitis.
5. Aortic atherosclerosis.
6. Signs of prior granulomatous disease about the LEFT hilum and
spleen.

Aortic Atherosclerosis (S0869-RTE.E).

## 2022-07-06 ENCOUNTER — Ambulatory Visit (INDEPENDENT_AMBULATORY_CARE_PROVIDER_SITE_OTHER): Payer: Self-pay | Admitting: Dermatology

## 2022-07-06 DIAGNOSIS — L988 Other specified disorders of the skin and subcutaneous tissue: Secondary | ICD-10-CM

## 2022-07-06 NOTE — Progress Notes (Signed)
   Follow-Up Visit   Subjective  Alyssa Webb is a 71 y.o. 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  The following portions of the chart were reviewed this encounter and updated as appropriate:   Tobacco  Allergies  Meds  Problems  Med Hx  Surg Hx  Fam Hx     Review of Systems:  No other skin or systemic complaints except as noted in HPI or Assessment and Plan.  Objective  Well appearing patient in no apparent distress; mood and affect are within normal limits.  A focused examination was performed including face. Relevant physical exam findings are noted in the Assessment and Plan.  Face Rhytides and volume loss.                        Assessment & Plan  Elastosis of skin Face  Botox today - 32.5 units   Frown Complex - 27.5 units Chin - 5 units  Restylane Refyne 1 cc to bilateral nasolabial folds and bilateral oral commissures  Botox Injection - Face Location: See attached image  Informed consent: Discussed risks (infection, pain, bleeding, bruising, swelling, allergic reaction, paralysis of nearby muscles, eyelid droop, double vision, neck weakness, difficulty breathing, headache, undesirable cosmetic result, and need for additional treatment) and benefits of the procedure, as well as the alternatives.  Informed consent was obtained.  Preparation: The area was cleansed with alcohol.  Procedure Details:  Botox was injected into the dermis with a 30-gauge needle. Pressure applied to any bleeding. Ice packs offered for swelling.  Lot Number:  [id number] Expiration:  09/2024  Total Units Injected:  32.5  Plan: Patient was instructed to remain upright for 4 hours. Patient was instructed to avoid massaging the face and avoid vigorous exercise for the rest of the day. Tylenol may be used for headache.  Allow 2 weeks before returning to clinic for additional dosing as needed. Patient will call for any  problems.   Filling material injection - Face Prior to the procedure, the patient's past medical history, allergies and the rare but potential risks and complications were reviewed with the patient and a signed consent was obtained. Pre and post-treatment care was discussed and instructions provided.  Location: nasolabial folds, oral commissures  Filler Type: Restylane Refyne  Lot #[id number]  Exp 03/28/2023  Procedure: The area was prepped thoroughly with Puracyn. After introducing the needle into the desired treatment area, the syringe plunger was drawn back to ensure there was no flash of blood prior to injecting the filler in order to minimize risk of intravascular injection and vascular occlusion.    Patient tolerated the procedure well. The patient will call with any problems, questions or concerns prior to their next appointment.    Return for Botox in 3-4 months.  I, Ashok Cordia, CMA, am acting as scribe for Sarina Ser, MD . Documentation: I have reviewed the above documentation for accuracy and completeness, and I agree with the above.  Sarina Ser, MD

## 2022-07-06 NOTE — Patient Instructions (Signed)
Due to recent changes in healthcare laws, you may see results of your pathology and/or laboratory studies on MyChart before the doctors have had a chance to review them. We understand that in some cases there may be results that are confusing or concerning to you. Please understand that not all results are received at the same time and often the doctors may need to interpret multiple results in order to provide you with the best plan of care or course of treatment. Therefore, we ask that you please give us 2 business days to thoroughly review all your results before contacting the office for clarification. Should we see a critical lab result, you will be contacted sooner.   If You Need Anything After Your Visit  If you have any questions or concerns for your doctor, please call our main line at [phone] and press option 4 to reach your doctor's medical assistant. If no one answers, please leave a voicemail as directed and we will return your call as soon as possible. Messages left after 4 pm will be answered the following business day.   You may also send us a message via MyChart. We typically respond to MyChart messages within 1-2 business days.  For prescription refills, please ask your pharmacy to contact our office. Our fax number is [phone].  If you have an urgent issue when the clinic is closed that cannot wait until the next business day, you can page your doctor at the number below.    Please note that while we do our best to be available for urgent issues outside of office hours, we are not available 24/7.   If you have an urgent issue and are unable to reach us, you may choose to seek medical care at your doctor's office, retail clinic, urgent care center, or emergency room.  If you have a medical emergency, please immediately call 911 or go to the emergency department.  Pager Numbers  - Dr. Kowalski: [phone]  - Dr. Moye: [phone]  - Dr. Stewart:  [phone]  In the event of inclement weather, please call our main line at [phone] for an update on the status of any delays or closures.  Dermatology Medication Tips: Please keep the boxes that topical medications come in in order to help keep track of the instructions about where and how to use these. Pharmacies typically print the medication instructions only on the boxes and not directly on the medication tubes.   If your medication is too expensive, please contact our office at [phone] option 4 or send us a message through MyChart.   We are unable to tell what your co-pay for medications will be in advance as this is different depending on your insurance coverage. However, we may be able to find a substitute medication at lower cost or fill out paperwork to get insurance to cover a needed medication.   If a prior authorization is required to get your medication covered by your insurance company, please allow us 1-2 business days to complete this process.  Drug prices often vary depending on where the prescription is filled and some pharmacies may offer cheaper prices.  The website www.goodrx.com contains coupons for medications through different pharmacies. The prices here do not account for what the cost may be with help from insurance (it may be cheaper with your insurance), but the website can give you the price if you did not use any insurance.  - You can print the associated coupon and take it with   your prescription to the pharmacy.  - You may also stop by our office during regular business hours and pick up a GoodRx coupon card.  - If you need your prescription sent electronically to a different pharmacy, notify our office through [facility] MyChart or by phone at [phone] option 4.     Si Usted Necesita Algo Despus de Su Visita  Tambin puede enviarnos un mensaje a travs de MyChart. Por lo general respondemos a los mensajes de MyChart en el transcurso de 1 a 2  das hbiles.  Para renovar recetas, por favor pida a su farmacia que se ponga en contacto con nuestra oficina. Nuestro nmero de fax es el [phone].  Si tiene un asunto urgente cuando la clnica est cerrada y que no puede esperar hasta el siguiente da hbil, puede llamar/localizar a su doctor(a) al nmero que aparece a continuacin.   Por favor, tenga en cuenta que aunque hacemos todo lo posible para estar disponibles para asuntos urgentes fuera del horario de oficina, no estamos disponibles las 24 horas del da, los 7 das de la semana.   Si tiene un problema urgente y no puede comunicarse con nosotros, puede optar por buscar atencin mdica  en el consultorio de su doctor(a), en una clnica privada, en un centro de atencin urgente o en una sala de emergencias.  Si tiene una emergencia mdica, por favor llame inmediatamente al 911 o vaya a la sala de emergencias.  Nmeros de bper  - Dr. Kowalski: [phone]  - Dra. Moye: [phone]  - Dra. Stewart: [phone]  En caso de inclemencias del tiempo, por favor llame a nuestra lnea principal al [phone] para una actualizacin sobre el estado de cualquier retraso o cierre.  Consejos para la medicacin en dermatologa: Por favor, guarde las cajas en las que vienen los medicamentos de uso tpico para ayudarle a seguir las instrucciones sobre dnde y cmo usarlos. Las farmacias generalmente imprimen las instrucciones del medicamento slo en las cajas y no directamente en los tubos del medicamento.   Si su medicamento es muy caro, por favor, pngase en contacto con nuestra oficina llamando al [phone] y presione la opcin 4 o envenos un mensaje a travs de MyChart.   No podemos decirle cul ser su copago por los medicamentos por adelantado ya que esto es diferente dependiendo de la cobertura de su seguro. Sin embargo, es posible que podamos encontrar un medicamento sustituto a menor costo o llenar un formulario para que el  seguro cubra el medicamento que se considera necesario.   Si se requiere una autorizacin previa para que su compaa de seguros cubra su medicamento, por favor permtanos de 1 a 2 das hbiles para completar este proceso.  Los precios de los medicamentos varan con frecuencia dependiendo del lugar de dnde se surte la receta y alguna farmacias pueden ofrecer precios ms baratos.  El sitio web www.goodrx.com tiene cupones para medicamentos de diferentes farmacias. Los precios aqu no tienen en cuenta lo que podra costar con la ayuda del seguro (puede ser ms barato con su seguro), pero el sitio web puede darle el precio si no utiliz ningn seguro.  - Puede imprimir el cupn correspondiente y llevarlo con su receta a la farmacia.  - Tambin puede pasar por nuestra oficina durante el horario de atencin regular y recoger una tarjeta de cupones de GoodRx.  - Si necesita que su receta se enve electrnicamente a una farmacia diferente, informe a nuestra oficina a travs de MyChart de [place]   o por telfono llamando al [phone] y presione la opcin 4.  

## 2022-07-12 ENCOUNTER — Encounter: Payer: Self-pay | Admitting: Dermatology

## 2022-11-08 ENCOUNTER — Ambulatory Visit: Payer: Medicare Other | Admitting: Dermatology

## 2022-11-08 DIAGNOSIS — L820 Inflamed seborrheic keratosis: Secondary | ICD-10-CM

## 2022-11-08 DIAGNOSIS — L988 Other specified disorders of the skin and subcutaneous tissue: Secondary | ICD-10-CM

## 2022-11-08 DIAGNOSIS — D1801 Hemangioma of skin and subcutaneous tissue: Secondary | ICD-10-CM

## 2022-11-08 NOTE — Progress Notes (Signed)
   Follow-Up Visit   Subjective  Alyssa Webb is a 71 y.o. female who presents for the following: OTHER (Patient here concerning a spot at right cheek states is growing. ) and Facial Elastosis (Elastosis of skin, patient here for botox follow up. ). The patient has spots, moles and lesions to be evaluated, some may be new or changing and the patient has concerns that these could be cancer.  The following portions of the chart were reviewed this encounter and updated as appropriate:  Tobacco  Allergies  Meds  Problems  Med Hx  Surg Hx  Fam Hx     Review of Systems: No other skin or systemic complaints except as noted in HPI or Assessment and Plan.  Objective  Well appearing patient in no apparent distress; mood and affect are within normal limits.  A focused examination was performed including face. Relevant physical exam findings are noted in the Assessment and Plan.  Head - Anterior (Face) Rhytides and volume loss.   right cheek x 1 Erythematous stuck-on, waxy papule or plaque   Assessment & Plan  Elastosis of skin Head - Anterior (Face) Botox Injection - Head - Anterior (Face) Location: frown complex and chin  Informed consent: Discussed risks (infection, pain, bleeding, bruising, swelling, allergic reaction, paralysis of nearby muscles, eyelid droop, double vision, neck weakness, difficulty breathing, headache, undesirable cosmetic result, and need for additional treatment) and benefits of the procedure, as well as the alternatives.  Informed consent was obtained.  Preparation: The area was cleansed with alcohol.  Procedure Details:  Botox was injected into the dermis with a 30-gauge needle. Pressure applied to any bleeding. Ice packs offered for swelling.  Lot Number:  [id number] Expiration:  12/2024  Total Units Injected:  32.5  Plan: Tylenol may be used for headache.  Allow 2 weeks before returning to clinic for additional dosing as needed. Patient will call for  any problems.  Inflamed seborrheic keratosis right cheek x 1 Symptomatic, irritating, patient would like treated. Will recheck at next follow up Destruction of lesion - right cheek x 1 Complexity: simple   Destruction method: cryotherapy   Informed consent: discussed and consent obtained   Timeout:  patient name, date of birth, surgical site, and procedure verified Lesion destroyed using liquid nitrogen: Yes   Region frozen until ice ball extended beyond lesion: Yes   Outcome: patient tolerated procedure well with no complications   Post-procedure details: wound care instructions given   Additional details:  Prior to procedure, discussed risks of blister formation, small wound, skin dyspigmentation, or rare scar following cryotherapy. Recommend Vaseline ointment to treated areas while healing.   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 IRuthell Rummage, CMA, am acting as scribe for Sarina Ser, MD. Documentation: I have reviewed the above documentation for accuracy and completeness, and I agree with the above.  Sarina Ser, MD

## 2022-11-08 NOTE — Patient Instructions (Addendum)
Seborrheic Keratosis  What causes seborrheic keratoses? Seborrheic keratoses are harmless, common skin growths that first appear during adult life.  As time goes by, more growths appear.  Some people may develop a large number of them.  Seborrheic keratoses appear on both covered and uncovered body parts.  They are not caused by sunlight.  The tendency to develop seborrheic keratoses can be inherited.  They vary in color from skin-colored to gray, brown, or even black.  They can be either smooth or have a rough, warty surface.   Seborrheic keratoses are superficial and look as if they were stuck on the skin.  Under the microscope this type of keratosis looks like layers upon layers of skin.  That is why at times the top layer may seem to fall off, but the rest of the growth remains and re-grows.    Treatment Seborrheic keratoses do not need to be treated, but can easily be removed in the office.  Seborrheic keratoses often cause symptoms when they rub on clothing or jewelry.  Lesions can be in the way of shaving.  If they become inflamed, they can cause itching, soreness, or burning.  Removal of a seborrheic keratosis can be accomplished by freezing, burning, or surgery. If any spot bleeds, scabs, or grows rapidly, please return to have it checked, as these can be an indication of a skin cancer.  Cryotherapy Aftercare  Wash gently with soap and water everyday.   Apply Vaseline and Band-Aid daily until healed.    Due to recent changes in healthcare laws, you may see results of your pathology and/or laboratory studies on MyChart before the doctors have had a chance to review them. We understand that in some cases there may be results that are confusing or concerning to you. Please understand that not all results are received at the same time and often the doctors may need to interpret multiple results in order to provide you with the best plan of care or course of treatment. Therefore, we ask that you  please give us 2 business days to thoroughly review all your results before contacting the office for clarification. Should we see a critical lab result, you will be contacted sooner.   If You Need Anything After Your Visit  If you have any questions or concerns for your doctor, please call our main line at [phone] and press option 4 to reach your doctor's medical assistant. If no one answers, please leave a voicemail as directed and we will return your call as soon as possible. Messages left after 4 pm will be answered the following business day.   You may also send us a message via MyChart. We typically respond to MyChart messages within 1-2 business days.  For prescription refills, please ask your pharmacy to contact our office. Our fax number is [phone].  If you have an urgent issue when the clinic is closed that cannot wait until the next business day, you can page your doctor at the number below.    Please note that while we do our best to be available for urgent issues outside of office hours, we are not available 24/7.   If you have an urgent issue and are unable to reach us, you may choose to seek medical care at your doctor's office, retail clinic, urgent care center, or emergency room.  If you have a medical emergency, please immediately call 911 or go to the emergency department.  Pager Numbers  - Dr. Kowalski: [phone]  -   Dr. Moye: [phone]  - Dr. Stewart: [phone]  In the event of inclement weather, please call our main line at [phone] for an update on the status of any delays or closures.  Dermatology Medication Tips: Please keep the boxes that topical medications come in in order to help keep track of the instructions about where and how to use these. Pharmacies typically print the medication instructions only on the boxes and not directly on the medication tubes.   If your medication is too expensive, please contact our office at  [phone] option 4 or send us a message through MyChart.   We are unable to tell what your co-pay for medications will be in advance as this is different depending on your insurance coverage. However, we may be able to find a substitute medication at lower cost or fill out paperwork to get insurance to cover a needed medication.   If a prior authorization is required to get your medication covered by your insurance company, please allow us 1-2 business days to complete this process.  Drug prices often vary depending on where the prescription is filled and some pharmacies may offer cheaper prices.  The website www.goodrx.com contains coupons for medications through different pharmacies. The prices here do not account for what the cost may be with help from insurance (it may be cheaper with your insurance), but the website can give you the price if you did not use any insurance.  - You can print the associated coupon and take it with your prescription to the pharmacy.  - You may also stop by our office during regular business hours and pick up a GoodRx coupon card.  - If you need your prescription sent electronically to a different pharmacy, notify our office through [facility] MyChart or by phone at [phone] option 4.     Si Usted Necesita Algo Despus de Su Visita  Tambin puede enviarnos un mensaje a travs de MyChart. Por lo general respondemos a los mensajes de MyChart en el transcurso de 1 a 2 das hbiles.  Para renovar recetas, por favor pida a su farmacia que se ponga en contacto con nuestra oficina. Nuestro nmero de fax es el [phone].  Si tiene un asunto urgente cuando la clnica est cerrada y que no puede esperar hasta el siguiente da hbil, puede llamar/localizar a su doctor(a) al nmero que aparece a continuacin.   Por favor, tenga en cuenta que aunque hacemos todo lo posible para estar disponibles para asuntos urgentes fuera del horario de oficina, no estamos  disponibles las 24 horas del da, los 7 das de la semana.   Si tiene un problema urgente y no puede comunicarse con nosotros, puede optar por buscar atencin mdica  en el consultorio de su doctor(a), en una clnica privada, en un centro de atencin urgente o en una sala de emergencias.  Si tiene una emergencia mdica, por favor llame inmediatamente al 911 o vaya a la sala de emergencias.  Nmeros de bper  - Dr. Kowalski: [phone]  - Dra. Moye: [phone]  - Dra. Stewart: [phone]  En caso de inclemencias del tiempo, por favor llame a nuestra lnea principal al [phone] para una actualizacin sobre el estado de cualquier retraso o cierre.  Consejos para la medicacin en dermatologa: Por favor, guarde las cajas en las que vienen los medicamentos de uso tpico para ayudarle a seguir las instrucciones sobre dnde y cmo usarlos. Las farmacias generalmente imprimen las instrucciones del medicamento slo en las cajas y   no directamente en los tubos del medicamento.   Si su medicamento es muy caro, por favor, pngase en contacto con nuestra oficina llamando al [phone] y presione la opcin 4 o envenos un mensaje a travs de MyChart.   No podemos decirle cul ser su copago por los medicamentos por adelantado ya que esto es diferente dependiendo de la cobertura de su seguro. Sin embargo, es posible que podamos encontrar un medicamento sustituto a menor costo o llenar un formulario para que el seguro cubra el medicamento que se considera necesario.   Si se requiere una autorizacin previa para que su compaa de seguros cubra su medicamento, por favor permtanos de 1 a 2 das hbiles para completar este proceso.  Los precios de los medicamentos varan con frecuencia dependiendo del lugar de dnde se surte la receta y alguna farmacias pueden ofrecer precios ms baratos.  El sitio web www.goodrx.com tiene cupones para medicamentos de diferentes farmacias. Los precios aqu no  tienen en cuenta lo que podra costar con la ayuda del seguro (puede ser ms barato con su seguro), pero el sitio web puede darle el precio si no utiliz ningn seguro.  - Puede imprimir el cupn correspondiente y llevarlo con su receta a la farmacia.  - Tambin puede pasar por nuestra oficina durante el horario de atencin regular y recoger una tarjeta de cupones de GoodRx.  - Si necesita que su receta se enve electrnicamente a una farmacia diferente, informe a nuestra oficina a travs de MyChart de [facility] o por telfono llamando al [phone] y presione la opcin 4.  

## 2022-11-15 ENCOUNTER — Encounter: Payer: Self-pay | Admitting: Dermatology

## 2023-03-15 ENCOUNTER — Ambulatory Visit (INDEPENDENT_AMBULATORY_CARE_PROVIDER_SITE_OTHER): Payer: Medicare Other | Admitting: Dermatology

## 2023-03-15 VITALS — BP 102/62 | HR 78

## 2023-03-15 DIAGNOSIS — L988 Other specified disorders of the skin and subcutaneous tissue: Secondary | ICD-10-CM

## 2023-03-15 NOTE — Progress Notes (Signed)
   Follow-Up Visit   Subjective  Alyssa Webb is a 72 y.o. female who presents for the following: 3 months f/u on Botox on her face.    The following portions of the chart were reviewed this encounter and updated as appropriate: medications, allergies, medical history  Review of Systems:  No other skin or systemic complaints except as noted in HPI or Assessment and Plan.  Objective  Well appearing patient in no apparent distress; mood and affect are within normal limits.  A focused examination was performed of the following areas:face   Relevant exam findings are noted in the Assessment and Plan.  face Rhytides and volume loss.       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  Informed consent was obtained.  Preparation: The area was cleansed with alcohol.  Procedure Details:  Botox was injected into the dermis with a 30-gauge needle. Pressure applied to any bleeding. Ice packs offered for swelling.  Lot Number:  [id number] Expiration:  02/2025  Total Units Injected:  32.5  Plan: Patient was instructed to remain upright for 4 hours. Patient was instructed to avoid massaging the face and avoid vigorous exercise for the rest of the day. Tylenol may be used for headache.  Allow 2 weeks before returning to clinic for additional dosing as needed. Patient will call for any problems.     Return in about 3 months (around 06/14/2023) for botox and TBSE.  IAngelique Holm, CMA, am acting as scribe for Armida Sans, MD .    Documentation: I have reviewed the above documentation for accuracy and completeness, and I agree with the above.  Armida Sans, MD

## 2023-03-15 NOTE — Patient Instructions (Signed)
Due to recent changes in healthcare laws, you may see results of your pathology and/or laboratory studies on MyChart before the doctors have had a chance to review them. We understand that in some cases there may be results that are confusing or concerning to you. Please understand that not all results are received at the same time and often the doctors may need to interpret multiple results in order to provide you with the best plan of care or course of treatment. Therefore, we ask that you please give us 2 business days to thoroughly review all your results before contacting the office for clarification. Should we see a critical lab result, you will be contacted sooner.   If You Need Anything After Your Visit  If you have any questions or concerns for your doctor, please call our main line at [phone] and press option 4 to reach your doctor's medical assistant. If no one answers, please leave a voicemail as directed and we will return your call as soon as possible. Messages left after 4 pm will be answered the following business day.   You may also send us a message via MyChart. We typically respond to MyChart messages within 1-2 business days.  For prescription refills, please ask your pharmacy to contact our office. Our fax number is [phone].  If you have an urgent issue when the clinic is closed that cannot wait until the next business day, you can page your doctor at the number below.    Please note that while we do our best to be available for urgent issues outside of office hours, we are not available 24/7.   If you have an urgent issue and are unable to reach us, you may choose to seek medical care at your doctor's office, retail clinic, urgent care center, or emergency room.  If you have a medical emergency, please immediately call 911 or go to the emergency department.  Pager Numbers  - Dr. Kowalski: [phone]  - Dr. Moye: [phone]  - Dr. Stewart:  [phone]  In the event of inclement weather, please call our main line at [phone] for an update on the status of any delays or closures.  Dermatology Medication Tips: Please keep the boxes that topical medications come in in order to help keep track of the instructions about where and how to use these. Pharmacies typically print the medication instructions only on the boxes and not directly on the medication tubes.   If your medication is too expensive, please contact our office at [phone] option 4 or send us a message through MyChart.   We are unable to tell what your co-pay for medications will be in advance as this is different depending on your insurance coverage. However, we may be able to find a substitute medication at lower cost or fill out paperwork to get insurance to cover a needed medication.   If a prior authorization is required to get your medication covered by your insurance company, please allow us 1-2 business days to complete this process.  Drug prices often vary depending on where the prescription is filled and some pharmacies may offer cheaper prices.  The website www.goodrx.com contains coupons for medications through different pharmacies. The prices here do not account for what the cost may be with help from insurance (it may be cheaper with your insurance), but the website can give you the price if you did not use any insurance.  - You can print the associated coupon and take it with   your prescription to the pharmacy.  - You may also stop by our office during regular business hours and pick up a GoodRx coupon card.  - If you need your prescription sent electronically to a different pharmacy, notify our office through [facility] MyChart or by phone at [phone] option 4.     Si Usted Necesita Algo Despus de Su Visita  Tambin puede enviarnos un mensaje a travs de MyChart. Por lo general respondemos a los mensajes de MyChart en el transcurso de 1 a 2  das hbiles.  Para renovar recetas, por favor pida a su farmacia que se ponga en contacto con nuestra oficina. Nuestro nmero de fax es el [phone].  Si tiene un asunto urgente cuando la clnica est cerrada y que no puede esperar hasta el siguiente da hbil, puede llamar/localizar a su doctor(a) al nmero que aparece a continuacin.   Por favor, tenga en cuenta que aunque hacemos todo lo posible para estar disponibles para asuntos urgentes fuera del horario de oficina, no estamos disponibles las 24 horas del da, los 7 das de la semana.   Si tiene un problema urgente y no puede comunicarse con nosotros, puede optar por buscar atencin mdica  en el consultorio de su doctor(a), en una clnica privada, en un centro de atencin urgente o en una sala de emergencias.  Si tiene una emergencia mdica, por favor llame inmediatamente al 911 o vaya a la sala de emergencias.  Nmeros de bper  - Dr. Kowalski: [phone]  - Dra. Moye: [phone]  - Dra. Stewart: [phone]  En caso de inclemencias del tiempo, por favor llame a nuestra lnea principal al [phone] para una actualizacin sobre el estado de cualquier retraso o cierre.  Consejos para la medicacin en dermatologa: Por favor, guarde las cajas en las que vienen los medicamentos de uso tpico para ayudarle a seguir las instrucciones sobre dnde y cmo usarlos. Las farmacias generalmente imprimen las instrucciones del medicamento slo en las cajas y no directamente en los tubos del medicamento.   Si su medicamento es muy caro, por favor, pngase en contacto con nuestra oficina llamando al [phone] y presione la opcin 4 o envenos un mensaje a travs de MyChart.   No podemos decirle cul ser su copago por los medicamentos por adelantado ya que esto es diferente dependiendo de la cobertura de su seguro. Sin embargo, es posible que podamos encontrar un medicamento sustituto a menor costo o llenar un formulario para que el  seguro cubra el medicamento que se considera necesario.   Si se requiere una autorizacin previa para que su compaa de seguros cubra su medicamento, por favor permtanos de 1 a 2 das hbiles para completar este proceso.  Los precios de los medicamentos varan con frecuencia dependiendo del lugar de dnde se surte la receta y alguna farmacias pueden ofrecer precios ms baratos.  El sitio web www.goodrx.com tiene cupones para medicamentos de diferentes farmacias. Los precios aqu no tienen en cuenta lo que podra costar con la ayuda del seguro (puede ser ms barato con su seguro), pero el sitio web puede darle el precio si no utiliz ningn seguro.  - Puede imprimir el cupn correspondiente y llevarlo con su receta a la farmacia.  - Tambin puede pasar por nuestra oficina durante el horario de atencin regular y recoger una tarjeta de cupones de GoodRx.  - Si necesita que su receta se enve electrnicamente a una farmacia diferente, informe a nuestra oficina a travs de MyChart de [facility]   o por telfono llamando al [phone] y presione la opcin 4.  

## 2023-03-27 ENCOUNTER — Encounter: Payer: Self-pay | Admitting: Dermatology

## 2023-06-29 ENCOUNTER — Ambulatory Visit: Payer: Medicare Other | Admitting: Dermatology

## 2023-06-29 VITALS — BP 105/65

## 2023-06-29 DIAGNOSIS — L821 Other seborrheic keratosis: Secondary | ICD-10-CM

## 2023-06-29 DIAGNOSIS — Z85828 Personal history of other malignant neoplasm of skin: Secondary | ICD-10-CM

## 2023-06-29 DIAGNOSIS — Z872 Personal history of diseases of the skin and subcutaneous tissue: Secondary | ICD-10-CM

## 2023-06-29 DIAGNOSIS — L578 Other skin changes due to chronic exposure to nonionizing radiation: Secondary | ICD-10-CM | POA: Diagnosis not present

## 2023-06-29 DIAGNOSIS — D229 Melanocytic nevi, unspecified: Secondary | ICD-10-CM

## 2023-06-29 DIAGNOSIS — Z1283 Encounter for screening for malignant neoplasm of skin: Secondary | ICD-10-CM | POA: Diagnosis not present

## 2023-06-29 DIAGNOSIS — L988 Other specified disorders of the skin and subcutaneous tissue: Secondary | ICD-10-CM | POA: Diagnosis not present

## 2023-06-29 DIAGNOSIS — D1801 Hemangioma of skin and subcutaneous tissue: Secondary | ICD-10-CM

## 2023-06-29 DIAGNOSIS — L814 Other melanin hyperpigmentation: Secondary | ICD-10-CM

## 2023-06-29 DIAGNOSIS — W908XXA Exposure to other nonionizing radiation, initial encounter: Secondary | ICD-10-CM | POA: Diagnosis not present

## 2023-06-29 DIAGNOSIS — L570 Actinic keratosis: Secondary | ICD-10-CM | POA: Diagnosis not present

## 2023-06-29 NOTE — Patient Instructions (Addendum)

## 2023-06-29 NOTE — Progress Notes (Signed)
Follow-Up Visit   Subjective  Alyssa Webb is a 72 y.o. female who presents for the following: Skin Cancer Screening and Full Body Skin Exam, hx of AKs The patient presents for Total-Body Skin Exam (TBSE) for skin cancer screening and mole check. The patient has spots, moles and lesions to be evaluated, some may be new or changing and the patient may have concern these could be cancer.  The following portions of the chart were reviewed this encounter and updated as appropriate: medications, allergies, medical history  Review of Systems:  No other skin or systemic complaints except as noted in HPI or Assessment and Plan.  Objective  Well appearing patient in no apparent distress; mood and affect are within normal limits.  A full examination was performed including scalp, head, eyes, ears, nose, lips, neck, chest, axillae, abdomen, back, buttocks, bilateral upper extremities, bilateral lower extremities, hands, feet, fingers, toes, fingernails, and toenails. All findings within normal limits unless otherwise noted below.   Relevant physical exam findings are noted in the Assessment and Plan.  Right Ear x 1, R nose x 1 (2) Pink scaly macules   Injection map    Assessment & Plan   SKIN CANCER SCREENING PERFORMED TODAY.  LENTIGINES, SEBORRHEIC KERATOSES, HEMANGIOMAS - Benign normal skin lesions - Benign-appearing - Call for any changes  MELANOCYTIC NEVI - Tan-brown and/or pink-flesh-colored symmetric macules and papules - Benign appearing on exam today - Observation - Call clinic for new or changing moles - Recommend daily use of broad spectrum spf 30+ sunscreen to sun-exposed areas.   FACIAL ELASTOSIS face Exam: Rhytides and volume loss.  Treatment Plan: Facial Elastosis Botox 32.5 units injected today to: - Frown complex 27.5 units - Chin  5 units  Location: frown complex, chin  Informed consent: Discussed risks (infection, pain, bleeding, bruising, swelling,  allergic reaction, paralysis of nearby muscles, eyelid droop, double vision, neck weakness, difficulty breathing, headache, undesirable cosmetic result, and need for additional treatment) and benefits of the procedure, as well as the alternatives.  Informed consent was obtained.  Preparation: The area was cleansed with alcohol.  Procedure Details:  Botox was injected into the dermis with a 30-gauge needle. Pressure applied to any bleeding. Ice packs offered for swelling.  Lot Number:  [id number] Expiration:  06/26  Total Units Injected:  32.5  Plan: Tylenol may be used for headache.  Allow 2 weeks before returning to clinic for additional dosing as needed. Patient will call for any problems.   Recommend daily broad spectrum sunscreen SPF 30+ to sun-exposed areas, reapply every 2 hours as needed. Call for new or changing lesions.  Staying in the shade or wearing long sleeves, sun glasses (UVA+UVB protection) and wide brim hats (4-inch brim around the entire circumference of the hat) are also recommended for sun protection.    AK (actinic keratosis) (2) Right Ear x 1, R nose x 1  Actinic keratoses are precancerous spots that appear secondary to cumulative UV radiation exposure/sun exposure over time. They are chronic with expected duration over 1 year. A portion of actinic keratoses will progress to squamous cell carcinoma of the skin. It is not possible to reliably predict which spots will progress to skin cancer and so treatment is recommended to prevent development of skin cancer.  Recommend daily broad spectrum sunscreen SPF 30+ to sun-exposed areas, reapply every 2 hours as needed.  Recommend staying in the shade or wearing long sleeves, sun glasses (UVA+UVB protection) and wide brim hats (4-inch brim  around the entire circumference of the hat). Call for new or changing lesions.  Destruction of lesion - Right Ear x 1, R nose x 1 (2) Complexity: simple   Destruction method: cryotherapy    Informed consent: discussed and consent obtained   Timeout:  patient name, date of birth, surgical site, and procedure verified Lesion destroyed using liquid nitrogen: Yes   Region frozen until ice ball extended beyond lesion: Yes   Outcome: patient tolerated procedure well with no complications   Post-procedure details: wound care instructions given    ACTINIC DAMAGE - chronic, secondary to cumulative UV radiation exposure/sun exposure over time - diffuse scaly erythematous macules with underlying dyspigmentation - Recommend daily broad spectrum sunscreen SPF 30+ to sun-exposed areas, reapply every 2 hours as needed.  - Recommend staying in the shade or wearing long sleeves, sun glasses (UVA+UVB protection) and wide brim hats (4-inch brim around the entire circumference of the hat). - Call for new or changing lesions.   HISTORY OF SKIN CANCER - Clear. Observe for recurrence.  - Call clinic for new or changing lesions.   - Recommend regular skin exams, daily broad-spectrum spf 30+ sunscreen use, and photoprotection.     - nose, txted yrs ago  Return for 3-59m Botox.  I, Ardis Rowan, RMA, am acting as scribe for Armida Sans, MD .  Documentation: I have reviewed the above documentation for accuracy and completeness, and I agree with the above.  Armida Sans, MD

## 2023-07-04 ENCOUNTER — Encounter: Payer: Self-pay | Admitting: Dermatology

## 2023-11-14 ENCOUNTER — Ambulatory Visit (INDEPENDENT_AMBULATORY_CARE_PROVIDER_SITE_OTHER): Payer: Self-pay | Admitting: Dermatology

## 2023-11-14 DIAGNOSIS — L988 Other specified disorders of the skin and subcutaneous tissue: Secondary | ICD-10-CM

## 2023-11-14 NOTE — Progress Notes (Signed)
   Follow-Up Visit   Subjective  Alyssa Webb is a 72 y.o. female who presents for the following: Botox for facial elastosis 3  - 4  botox follow up  The following portions of the chart were reviewed this encounter and updated as appropriate: medications, allergies, medical history  Review of Systems:  No other skin or systemic complaints except as noted in HPI or Assessment and Plan.  Objective  Well appearing patient in no apparent distress; mood and affect are within normal limits.  A focused examination was performed of the face.  Relevant physical exam findings are noted in the Assessment and Plan.       Assessment & Plan   ELASTOSIS OF SKIN   Facial Elastosis  Location: See attached image  Informed consent: Discussed risks (infection, pain, bleeding, bruising, swelling, allergic reaction, paralysis of nearby muscles, eyelid droop, double vision, neck weakness, difficulty breathing, headache, undesirable cosmetic result, and need for additional treatment) and benefits of the procedure, as well as the alternatives.  Informed consent was obtained.  Preparation: The area was cleansed with alcohol.  Procedure Details:  Botox was injected into the dermis with a 30-gauge needle. Pressure applied to any bleeding. Ice packs offered for swelling.  Lot Number:  [id number] Expiration:  07/2025  Total Units Injected:  32.5 units  Plan: Tylenol may be used for headache.  Allow 2 weeks before returning to clinic for additional dosing as needed. Patient will call for any problems.  Return for 3 - 6 month botox and filler .  IAsher Muir, CMA, am acting as scribe for Armida Sans, MD.   Documentation: I have reviewed the above documentation for accuracy and completeness, and I agree with the above.  Armida Sans, MD

## 2023-11-14 NOTE — Progress Notes (Deleted)
   Follow-Up Visit   Subjective  Alyssa Webb is a 72 y.o. female who presents for the following: Botox for facial elastosis  The following portions of the chart were reviewed this encounter and updated as appropriate: medications, allergies, medical history  Review of Systems:  No other skin or systemic complaints except as noted in HPI or Assessment and Plan.  Objective  Well appearing patient in no apparent distress; mood and affect are within normal limits.  A focused examination was performed of the face.  Relevant physical exam findings are noted in the Assessment and Plan.  ***Before botox photos if first time, Injection map photo***    Assessment & Plan    Facial Elastosis  Location: See attached image  Informed consent: Discussed risks (infection, pain, bleeding, bruising, swelling, allergic reaction, paralysis of nearby muscles, eyelid droop, double vision, neck weakness, difficulty breathing, headache, undesirable cosmetic result, and need for additional treatment) and benefits of the procedure, as well as the alternatives.  Informed consent was obtained.  Preparation: The area was cleansed with alcohol.  Procedure Details:  Botox was injected into the dermis with a 30-gauge needle. Pressure applied to any bleeding. Ice packs offered for swelling.  Lot Number:  *** Expiration:  ***  Total Units Injected:  ***  Plan: Tylenol may be used for headache.  Allow 2 weeks before returning to clinic for additional dosing as needed. Patient will call for any problems.  No follow-ups on file.  IAsher Muir, CMA, am acting as scribe for Armida Sans, MD.   Documentation: I have reviewed the above documentation for accuracy and completeness, and I agree with the above.  Armida Sans, MD

## 2023-11-14 NOTE — Patient Instructions (Signed)

## 2023-11-20 ENCOUNTER — Encounter: Payer: Self-pay | Admitting: Dermatology

## 2024-04-09 ENCOUNTER — Ambulatory Visit: Payer: Medicare Other | Admitting: Dermatology

## 2024-11-19 ENCOUNTER — Ambulatory Visit: Admitting: Dermatology

## 2024-11-19 ENCOUNTER — Encounter: Payer: Self-pay | Admitting: Dermatology

## 2024-11-19 DIAGNOSIS — L988 Other specified disorders of the skin and subcutaneous tissue: Secondary | ICD-10-CM

## 2024-11-19 NOTE — Patient Instructions (Signed)

## 2024-11-19 NOTE — Progress Notes (Signed)
" ° °  Follow-Up Visit   Subjective  Alyssa  GORMAN Webb is a 73 y.o. female who presents for the following: Botox for facial elastosis  The following portions of the chart were reviewed this encounter and updated as appropriate: medications, allergies, medical history  Review of Systems:  No other skin or systemic complaints except as noted in HPI or Assessment and Plan.  Objective  Well appearing patient in no apparent distress; mood and affect are within normal limits.  A focused examination was performed of the face.  Relevant physical exam findings are noted in the Assessment and Plan.      Assessment & Plan    Facial Elastosis                 Location: See attached image  Informed consent: Discussed risks (infection, pain, bleeding, bruising, swelling, allergic reaction, paralysis of nearby muscles, eyelid droop, double vision, neck weakness, difficulty breathing, headache, undesirable cosmetic result, and need for additional treatment) and benefits of the procedure, as well as the alternatives.  Informed consent was obtained.  Preparation: The area was cleansed with alcohol.  Procedure Details:  Botox was injected into the dermis with a 30-gauge needle. Pressure applied to any bleeding. Ice packs offered for swelling.  Lot Number:  [id number] Expiration:  09/2026  Total Units Injected:  32.5  Plan: Tylenol may be used for headache.  Allow 2 weeks before returning to clinic for additional dosing as needed. Patient will call for any problems.  Return in about 4 months (around 03/20/2025) for Botox injections.  LILLETTE Alyssa Webb, CMA, am acting as scribe for Alm Rhyme, MD .  Documentation: I have reviewed the above documentation for accuracy and completeness, and I agree with the above.  Alm Rhyme, MD      "

## 2025-03-24 ENCOUNTER — Ambulatory Visit: Admitting: Dermatology
